# Patient Record
Sex: Male | Born: 1960 | Race: White | Hispanic: No | Marital: Married | Smoking: Never smoker
Health system: Southern US, Community
[De-identification: ages and names within clinical notes are randomized; demographics above are authoritative.]

## PROBLEM LIST (undated history)

## (undated) DIAGNOSIS — C029 Malignant neoplasm of tongue, unspecified: Secondary | ICD-10-CM

## (undated) HISTORY — DX: Malignant neoplasm of tongue, unspecified: C02.9

## (undated) HISTORY — PX: TONSILLECTOMY: SUR1361

---

## 1979-01-10 HISTORY — PX: OTHER SURGICAL HISTORY: SHX169

## 1994-01-09 HISTORY — PX: ANAL FISSURE REPAIR: SHX2312

## 2006-05-15 ENCOUNTER — Encounter: Payer: Self-pay | Admitting: Internal Medicine

## 2008-04-29 ENCOUNTER — Ambulatory Visit: Payer: Self-pay | Admitting: Family Medicine

## 2008-04-30 LAB — CONVERTED CEMR LAB
ALT: 17 units/L (ref 0–53)
AST: 22 units/L (ref 0–37)
Basophils Relative: 0.2 % (ref 0.0–3.0)
Bilirubin, Direct: 0.1 mg/dL (ref 0.0–0.3)
Chloride: 109 meq/L (ref 96–112)
Creatinine, Ser: 0.8 mg/dL (ref 0.4–1.5)
Eosinophils Relative: 2.7 % (ref 0.0–5.0)
GFR calc non Af Amer: 109.49 mL/min (ref >60)
HCT: 42.9 % (ref 39.0–52.0)
LDL Cholesterol: 140 mg/dL — ABNORMAL HIGH (ref 0–99)
MCV: 94.2 fL (ref 78.0–100.0)
Monocytes Absolute: 0.4 10*3/uL (ref 0.1–1.0)
Monocytes Relative: 7.9 % (ref 3.0–12.0)
Neutrophils Relative %: 62.6 % (ref 43.0–77.0)
Potassium: 4 meq/L (ref 3.5–5.1)
RBC: 4.56 M/uL (ref 4.22–5.81)
Total Bilirubin: 1 mg/dL (ref 0.3–1.2)
Total CHOL/HDL Ratio: 4
Total Protein: 6.6 g/dL (ref 6.0–8.3)
VLDL: 10.2 mg/dL (ref 0.0–40.0)
WBC: 4.5 10*3/uL (ref 4.5–10.5)

## 2009-11-09 ENCOUNTER — Telehealth: Payer: Self-pay | Admitting: Family Medicine

## 2009-11-10 ENCOUNTER — Ambulatory Visit: Payer: Self-pay | Admitting: Family Medicine

## 2010-02-08 NOTE — Progress Notes (Signed)
Summary: advise of bite  Phone Note Call from Patient Call back at [phone]   Caller: Patient--triage vm Summary of Call: was bitten by a dog on yesterday. wife suggests abx. wants advise of the nurse.    [facility]. Initial call taken by: Warnell Forester,  November 09, 2009 12:58 PM  Follow-up for Phone Call        left mess to return call  Follow-up by: Pura Spice, RN,  November 09, 2009 3:11 PM  Additional Follow-up for Phone Call Additional follow up Details #1::        spoke with pt stated got bit by dog yest( neighbors dog that he says is up to date with shots )  states the dog did break skin on rt hand which today he says area bruised "little red " no drainage from area. stateds he cleaned with H202 and abt oint.  does not know when last tetanus was.  appt given tomorrrow with Dr Clent Ridges.  Additional Follow-up by: Pura Spice, RN,  November 09, 2009 4:21 PM

## 2010-02-08 NOTE — Progress Notes (Signed)
Summary: [facility]P/[facility]  H[facility]P/[facility]   Imported By: Sherian Rein 06/19/2009 09:49:27  _____________________________________________________________________  External Attachment:    Type:   Image     Comment:   External Document

## 2010-02-08 NOTE — Assessment & Plan Note (Signed)
Summary: dog bite ? tdap / gh rn   Vital Signs:  Patient profile:   50 year old male O2 Sat:      98 % Temp:     98.4 degrees F Pulse rate:   70 / minute BP sitting:   120 / 84  (left arm)  Vitals Entered By: Pura Spice, RN (November 10, 2009 4:13 PM)                   CC: ck dog bite on rt palm hand    History of Present Illness: Here for a dog bite on the right hand that occurred yesterday. he went to pet his neighbor's dog and apparently this startled the dog. he bit him lightly, but otherwise the dog's behavior has been unremarkable. He feels fine other than some soreness at the site. He is not sure about the status of the dog's immunizations.   Allergies (verified): No Known Drug Allergies  Past History:  Past Medical History: Reviewed history from 04/29/2008 and no changes required. Unremarkable  Review of Systems  The patient denies anorexia, fever, weight loss, weight gain, vision loss, decreased hearing, hoarseness, chest pain, syncope, dyspnea on exertion, peripheral edema, prolonged cough, headaches, hemoptysis, abdominal pain, melena, hematochezia, severe indigestion/heartburn, hematuria, incontinence, genital sores, muscle weakness, suspicious skin lesions, transient blindness, difficulty walking, depression, unusual weight change, abnormal bleeding, enlarged lymph nodes, angioedema, breast masses, and testicular masses.    Physical Exam  General:  Well-developed,well-nourished,in no acute distress; alert,appropriate and cooperative throughout examination Extremities:  the right hand has a superficial puncture wound on the palm which is slightly puffy and red, and is a little tender. The fingers have full ROM   Impression & Recommendations:  Problem # 1:  DOG BITE (ICD-E906.0)  Complete Medication List: 1)  Bayer Low Strength 81 Mg Tbec (Aspirin) .... Take 1 tablet by mouth once a day 2)  Augmentin 875-125 Mg Tabs (Amoxicillin-pot  clavulanate) .... Two times a day  Other Orders: Tdap => 62yrs IM (51761) Admin 1st Vaccine (60737)  Patient Instructions: 1)  Please schedule a follow-up appointment as needed .  Prescriptions: AUGMENTIN 875-125 MG TABS (AMOXICILLIN-POT CLAVULANATE) two times a day  #14 x 0   Entered and Authorized by:   Nelwyn Salisbury MD   Signed by:   Nelwyn Salisbury MD on 11/10/2009   Method used:   [facility]        [facility]  [place] [address] [facility]       [address]       [place], [place]  [address]       Ph: [phone] or [phone]       Fax: [phone]   RxID:   [id number]    Orders Added: 1)  Tdap => 52yrs IM [90715] 2)  Admin 1st Vaccine [90471] 3)  Est. Patient Level III [82423]   Immunizations Administered:  Tetanus Vaccine:    Vaccine Type: Tdap    Site: left deltoid    Mfr: GlaxoSmithKline    Dose: 0.5 ml    Given by: Pura Spice, RN    Exp. Date: 10/29/2011    Lot #: [id number]    VIS given: 11/27/07 version given November 10, 2009.   Immunizations Administered:  Tetanus Vaccine:    Vaccine Type: Tdap    Site: left deltoid    Mfr: GlaxoSmithKline    Dose: 0.5 ml    Given by: Rene Kocher  Marlynn Perking, RN    Exp. Date: 10/29/2011    Lot #: [id number]    VIS given: 11/27/07 version given November 10, 2009.

## 2010-05-25 ENCOUNTER — Emergency Department (HOSPITAL_COMMUNITY)
Admission: EM | Admit: 2010-05-25 | Discharge: 2010-05-25 | Disposition: A | Discharge status: Home / Self Care | Payer: BC Managed Care – PPO | Attending: Emergency Medicine | Admitting: Emergency Medicine

## 2010-05-25 ENCOUNTER — Emergency Department (HOSPITAL_COMMUNITY): Payer: BC Managed Care – PPO

## 2010-05-25 DIAGNOSIS — S61209A Unspecified open wound of unspecified finger without damage to nail, initial encounter: Secondary | ICD-10-CM | POA: Insufficient documentation

## 2010-05-25 DIAGNOSIS — W278XXA Contact with other nonpowered hand tool, initial encounter: Secondary | ICD-10-CM | POA: Insufficient documentation

## 2010-05-25 DIAGNOSIS — Y92009 Unspecified place in unspecified non-institutional (private) residence as the place of occurrence of the external cause: Secondary | ICD-10-CM | POA: Insufficient documentation

## 2010-05-25 DIAGNOSIS — Y93H2 Activity, gardening and landscaping: Secondary | ICD-10-CM | POA: Insufficient documentation

## 2010-08-25 ENCOUNTER — Ambulatory Visit (INDEPENDENT_AMBULATORY_CARE_PROVIDER_SITE_OTHER): Payer: BC Managed Care – PPO | Admitting: Family Medicine

## 2010-08-25 ENCOUNTER — Encounter: Payer: Self-pay | Admitting: Family Medicine

## 2010-08-25 ENCOUNTER — Ambulatory Visit: Payer: BC Managed Care – PPO | Admitting: Family Medicine

## 2010-08-25 VITALS — BP 110/66 | HR 64 | Temp 98.2°F | Wt 194.0 lb

## 2010-08-25 DIAGNOSIS — H60399 Other infective otitis externa, unspecified ear: Secondary | ICD-10-CM

## 2010-08-25 DIAGNOSIS — H6090 Unspecified otitis externa, unspecified ear: Secondary | ICD-10-CM

## 2010-08-25 MED ORDER — NEOMYCIN-POLYMYXIN-HC 1 % OT SOLN: 4.0000 [drp] | Freq: Four times a day (QID) | OTIC | Status: DC

## 2010-08-25 NOTE — Progress Notes (Signed)
  Subjective:    Patient ID: Ryan Ingram, male    DOB: February 22, 1960, 50 y.o.   MRN: [id number]  HPI Here for 2 days of pressure and mild discomfort in the left ear. No real pain. No fever or sinus symptoms. He has been swimming lately.    Review of Systems  Constitutional: Negative.   HENT: Negative for hearing loss, ear pain, congestion, sore throat, postnasal drip and sinus pressure.   Eyes: Negative.        Objective:   Physical Exam  Constitutional: He appears well-developed and well-nourished.  HENT:  Right Ear: External ear normal.  Nose: Nose normal.  Mouth/Throat: Oropharynx is clear and moist. No oropharyngeal exudate.       Left external canal is red and swollen, and the TM is clear  Eyes: Conjunctivae are normal. Pupils are equal, round, and reactive to light.  Neck: No thyromegaly present.  Lymphadenopathy:    He has no cervical adenopathy.          Assessment & Plan:  Keep water out  of the ear for one week.

## 2011-05-30 ENCOUNTER — Telehealth: Payer: Self-pay

## 2011-05-30 NOTE — Telephone Encounter (Signed)
Pt states he found a tick on him this morning.  Pt states he has kept the tick in a bag.  Pt denies fever or redness at the site.  Pt would like to know if there is anything else he needs to do.  Pls advise.

## 2011-05-31 NOTE — Telephone Encounter (Signed)
Spoke with pt

## 2011-05-31 NOTE — Telephone Encounter (Signed)
Just observe and come in for OV if he gets any rash or fever or other symptoms

## 2011-06-13 ENCOUNTER — Other Ambulatory Visit: Payer: BC Managed Care – PPO

## 2011-06-14 ENCOUNTER — Other Ambulatory Visit (INDEPENDENT_AMBULATORY_CARE_PROVIDER_SITE_OTHER): Payer: BC Managed Care – PPO

## 2011-06-14 DIAGNOSIS — Z0000 Encounter for general adult medical examination without abnormal findings: Secondary | ICD-10-CM

## 2011-06-14 LAB — BASIC METABOLIC PANEL
BUN: 16 mg/dL (ref 6–23)
CO2: 27 mEq/L (ref 19–32)
Calcium: 9 mg/dL (ref 8.4–10.5)
Creatinine, Ser: 0.8 mg/dL (ref 0.4–1.5)
GFR: 106.57 mL/min (ref >60.00)
Glucose, Bld: 94 mg/dL (ref 70–99)
Sodium: 140 mEq/L (ref 135–145)

## 2011-06-14 LAB — POCT URINALYSIS DIPSTICK
Bilirubin, UA: NEGATIVE
Blood, UA: NEGATIVE
Glucose, UA: NEGATIVE
Leukocytes, UA: NEGATIVE
Nitrite, UA: NEGATIVE
Urobilinogen, UA: 0.2

## 2011-06-14 LAB — CBC WITH DIFFERENTIAL/PLATELET
Basophils Absolute: 0 10*3/uL (ref 0.0–0.1)
Eosinophils Absolute: 0.1 10*3/uL (ref 0.0–0.7)
Lymphocytes Relative: 25.7 % (ref 12.0–46.0)
MCHC: 33.2 g/dL (ref 30.0–36.0)
Monocytes Relative: 8.2 % (ref 3.0–12.0)
Neutro Abs: 3.3 10*3/uL (ref 1.4–7.7)
Neutrophils Relative %: 63 % (ref 43.0–77.0)
Platelets: 195 10*3/uL (ref 150.0–400.0)
RDW: 13 % (ref 11.5–14.6)

## 2011-06-14 LAB — HEPATIC FUNCTION PANEL
AST: 19 U/L (ref 0–37)
Albumin: 3.9 g/dL (ref 3.5–5.2)
Alkaline Phosphatase: 52 U/L (ref 39–117)
Bilirubin, Direct: 0.1 mg/dL (ref 0.0–0.3)
Total Protein: 6.4 g/dL (ref 6.0–8.3)

## 2011-06-14 LAB — TSH: TSH: 2.35 u[IU]/mL (ref 0.35–5.50)

## 2011-06-14 LAB — LIPID PANEL
HDL: 47.9 mg/dL (ref >39.00)
Total CHOL/HDL Ratio: 4
Triglycerides: 72 mg/dL (ref 0.0–149.0)

## 2011-06-15 NOTE — Progress Notes (Signed)
Quick Note:  Attempt to call- VM on cell# [phone] - LMTCB if questions - lab normal except mildly high cholestrol - diet and exercise ______

## 2011-06-16 ENCOUNTER — Encounter: Payer: Self-pay | Admitting: Family Medicine

## 2011-06-29 ENCOUNTER — Encounter: Payer: BC Managed Care – PPO | Admitting: Family Medicine

## 2011-08-22 ENCOUNTER — Ambulatory Visit (INDEPENDENT_AMBULATORY_CARE_PROVIDER_SITE_OTHER): Payer: BC Managed Care – PPO | Admitting: Family Medicine

## 2011-08-22 ENCOUNTER — Encounter: Payer: Self-pay | Admitting: Family Medicine

## 2011-08-22 VITALS — BP 90/60 | HR 72 | Temp 98.4°F | Resp 16 | Ht 73.25 in | Wt 200.0 lb

## 2011-08-22 DIAGNOSIS — Z0000 Encounter for general adult medical examination without abnormal findings: Secondary | ICD-10-CM

## 2011-08-22 NOTE — Progress Notes (Signed)
  Subjective:    Patient ID: Ryan Ingram, male    DOB: 30-Nov-1960, 51 y.o.   MRN: [id number]  HPI 51 yr old male for a cpx. He feels fine and has no concerns.    Review of Systems  Constitutional: Negative.   HENT: Negative.   Eyes: Negative.   Respiratory: Negative.   Cardiovascular: Negative.   Gastrointestinal: Negative.   Genitourinary: Negative.   Musculoskeletal: Negative.   Skin: Negative.   Neurological: Negative.   Hematological: Negative.   Psychiatric/Behavioral: Negative.        Objective:   Physical Exam  Constitutional: He is oriented to person, place, and time. He appears well-developed and well-nourished. No distress.  HENT:  Head: Normocephalic and atraumatic.  Right Ear: External ear normal.  Left Ear: External ear normal.  Nose: Nose normal.  Mouth/Throat: Oropharynx is clear and moist. No oropharyngeal exudate.  Eyes: Conjunctivae and EOM are normal. Pupils are equal, round, and reactive to light. Right eye exhibits no discharge. Left eye exhibits no discharge. No scleral icterus.  Neck: Neck supple. No JVD present. No tracheal deviation present. No thyromegaly present.  Cardiovascular: Normal rate, regular rhythm, normal heart sounds and intact distal pulses.  Exam reveals no gallop and no friction rub.   No murmur heard.      EKG normal  Pulmonary/Chest: Effort normal and breath sounds normal. No respiratory distress. He has no wheezes. He has no rales. He exhibits no tenderness.  Abdominal: Soft. Bowel sounds are normal. He exhibits no distension and no mass. There is no tenderness. There is no rebound and no guarding.  Genitourinary: Rectum normal, prostate normal and penis normal. Guaiac negative stool. No penile tenderness.  Musculoskeletal: Normal range of motion. He exhibits no edema and no tenderness.  Lymphadenopathy:    He has no cervical adenopathy.  Neurological: He is alert and oriented to person, place, and time. He has normal  reflexes. No cranial nerve deficit. He exhibits normal muscle tone. Coordination normal.  Skin: Skin is warm and dry. No rash noted. He is not diaphoretic. No erythema. No pallor.  Psychiatric: He has a normal mood and affect. His behavior is normal. Judgment and thought content normal.          Assessment & Plan:  Well exam. Set up a colonoscopy

## 2012-01-05 ENCOUNTER — Ambulatory Visit (INDEPENDENT_AMBULATORY_CARE_PROVIDER_SITE_OTHER): Payer: BC Managed Care – PPO | Admitting: Family Medicine

## 2012-01-05 ENCOUNTER — Encounter: Payer: Self-pay | Admitting: Family Medicine

## 2012-01-05 VITALS — BP 120/80 | HR 71 | Temp 98.0°F | Ht 72.5 in | Wt 202.0 lb

## 2012-01-05 DIAGNOSIS — J329 Chronic sinusitis, unspecified: Secondary | ICD-10-CM

## 2012-01-05 MED ORDER — AMOXICILLIN 875 MG PO TABS: 875.0000 mg | ORAL_TABLET | Freq: Two times a day (BID) | ORAL | Status: DC

## 2012-01-05 NOTE — Patient Instructions (Addendum)
INSTRUCTIONS FOR UPPER RESPIRATORY INFECTION:  -plenty of rest and fluids  -take antibiotic as instructed  -nasal saline wash 2-3 times daily (use prepackaged nasal saline or bottled/distilled water if making your own)   -can use sinex nasal spray for drainage and nasal congestion - but do NOT use longer then 3-4 days  -can use tylenol or ibuprofen as directed for aches and sorethroat  -in the winter time, using a humidifier at night is helpful (please follow cleaning instructions)  -if you are taking a cough medication - use only as directed, may also try a teaspoon of honey to coat the throat and throat lozenges  -for sore throat, salt water gargles can help  -follow up if you have fevers, facial pain, tooth pain, difficulty breathing or are worsening or not getting better in 5-7 days

## 2012-01-05 NOTE — Progress Notes (Signed)
Chief Complaint  Patient presents with  . Sinusitis    HPI:  Acute visit for ? Ear pain: -last 7 days, getting worse -symptoms: nasal congestion, drainage, cough, R sided maxillary sinus  pain, slept with night guard and cause some jaw pain -denies: fevers, chills, SOB -hx of sinus infection in the past - last year was most recent one -family members with cold symptoms -has tried OTC cold medication and advil  ROS: See pertinent positives and negatives per HPI.  No past medical history on file.  Family History  Problem Relation Age of Onset  . Cancer      breast & lung  . Coronary artery disease    . Hyperlipidemia    . Hypertension      History   Social History  . Marital Status: Married    Spouse Name: N/A    Number of Children: N/A  . Years of Education: N/A   Social History Main Topics  . Smoking status: Never Smoker   . Smokeless tobacco: Never Used  . Alcohol Use: Yes  . Drug Use: No  . Sexually Active: None   Other Topics Concern  . None   Social History Narrative  . None    Current outpatient prescriptions:amoxicillin (AMOXIL) 875 MG tablet, Take 1 tablet (875 mg total) by mouth 2 (two) times daily., Disp: 20 tablet, Rfl: 0  EXAM:  Filed Vitals:   01/05/12 1022  BP: 120/80  Pulse: 71  Temp: 98 F (36.7 C)    Body mass index is 27.02 kg/(m^2).  GENERAL: vitals reviewed and listed above, alert, oriented, appears well hydrated and in no acute distress  HEENT: atraumatic, conjunttiva clear, no obvious abnormalities on inspection of external nose and ears, ear canals and TMs without sihgnsof infection, L tM abnormal in architecture (pt reports hx rupture), white nasal congesiton, R max sinus TTP, PND  NECK: no obvious masses on inspection  LUNGS: clear to auscultation bilaterally, no wheezes, rales or rhonchi, good air movement  CV: HRRR, no peripheral edema  MS: moves all extremities without noticeable abnormality  PSYCH: pleasant and  cooperative, no obvious depression or anxiety  ASSESSMENT AND PLAN:  Discussed the following assessment and plan:  1. Sinusitis  amoxicillin (AMOXIL) 875 MG tablet   -with R max sinus pain and worsening, discussed tx options including watchful waiting versus abx - pt prefers to start abx and understands risks -Patient advised to return or notify a doctor immediately if symptoms worsen or persist or new concerns arise.  Patient Instructions  INSTRUCTIONS FOR UPPER RESPIRATORY INFECTION:  -plenty of rest and fluids  -take antibiotic as instructed  -nasal saline wash 2-3 times daily (use prepackaged nasal saline or bottled/distilled water if making your own)   -can use sinex nasal spray for drainage and nasal congestion - but do NOT use longer then 3-4 days  -can use tylenol or ibuprofen as directed for aches and sorethroat  -in the winter time, using a humidifier at night is helpful (please follow cleaning instructions)  -if you are taking a cough medication - use only as directed, may also try a teaspoon of honey to coat the throat and throat lozenges  -for sore throat, salt water gargles can help  -follow up if you have fevers, facial pain, tooth pain, difficulty breathing or are worsening or not getting better in 5-7 days      Zahava Quant R.

## 2012-11-15 ENCOUNTER — Other Ambulatory Visit (INDEPENDENT_AMBULATORY_CARE_PROVIDER_SITE_OTHER): Payer: BC Managed Care – PPO

## 2012-11-15 ENCOUNTER — Ambulatory Visit (INDEPENDENT_AMBULATORY_CARE_PROVIDER_SITE_OTHER): Payer: BC Managed Care – PPO

## 2012-11-15 DIAGNOSIS — Z23 Encounter for immunization: Secondary | ICD-10-CM

## 2012-11-15 DIAGNOSIS — Z0000 Encounter for general adult medical examination without abnormal findings: Secondary | ICD-10-CM

## 2012-11-15 LAB — POCT URINALYSIS DIPSTICK
Bilirubin, UA: NEGATIVE
Blood, UA: NEGATIVE
Ketones, UA: NEGATIVE
Leukocytes, UA: NEGATIVE
Spec Grav, UA: 1.02
pH, UA: 5.5

## 2012-11-15 LAB — CBC WITH DIFFERENTIAL/PLATELET
Basophils Relative: 0.5 % (ref 0.0–3.0)
Eosinophils Relative: 1.9 % (ref 0.0–5.0)
Hemoglobin: 14.3 g/dL (ref 13.0–17.0)
Lymphocytes Relative: 26.1 % (ref 12.0–46.0)
Monocytes Relative: 7.9 % (ref 3.0–12.0)
Neutro Abs: 3.8 10*3/uL (ref 1.4–7.7)
Neutrophils Relative %: 63.6 % (ref 43.0–77.0)
RBC: 4.7 Mil/uL (ref 4.22–5.81)
WBC: 5.9 10*3/uL (ref 4.5–10.5)

## 2012-11-15 LAB — BASIC METABOLIC PANEL
Calcium: 9 mg/dL (ref 8.4–10.5)
Creatinine, Ser: 0.9 mg/dL (ref 0.4–1.5)
Sodium: 138 mEq/L (ref 135–145)

## 2012-11-15 LAB — HEPATIC FUNCTION PANEL
ALT: 18 U/L (ref 0–53)
AST: 21 U/L (ref 0–37)
Alkaline Phosphatase: 52 U/L (ref 39–117)
Bilirubin, Direct: 0.1 mg/dL (ref 0.0–0.3)
Total Protein: 6.7 g/dL (ref 6.0–8.3)

## 2012-11-15 LAB — LIPID PANEL
HDL: 46.8 mg/dL (ref >39.00)
LDL Cholesterol: 121 mg/dL — ABNORMAL HIGH (ref 0–99)
VLDL: 17.2 mg/dL (ref 0.0–40.0)

## 2012-11-18 NOTE — Progress Notes (Signed)
Quick Note:  Pt has appointment on 11/22/12 will go over then. ______

## 2012-11-22 ENCOUNTER — Encounter: Payer: Self-pay | Admitting: Family Medicine

## 2012-11-22 ENCOUNTER — Ambulatory Visit (INDEPENDENT_AMBULATORY_CARE_PROVIDER_SITE_OTHER): Payer: BC Managed Care – PPO | Admitting: Family Medicine

## 2012-11-22 VITALS — BP 120/80 | HR 68 | Temp 97.8°F | Ht 72.5 in | Wt 200.0 lb

## 2012-11-22 DIAGNOSIS — Z0000 Encounter for general adult medical examination without abnormal findings: Secondary | ICD-10-CM

## 2012-11-22 NOTE — Progress Notes (Signed)
  Subjective:    Patient ID: Ryan Ingram, male    DOB: July 03, 1960, 52 y.o.   MRN: [id number]  HPI 52 yr old male for a cpx. He feels well.    Review of Systems  Constitutional: Negative.   HENT: Negative.   Eyes: Negative.   Respiratory: Negative.   Cardiovascular: Negative.   Gastrointestinal: Negative.   Genitourinary: Negative.   Musculoskeletal: Negative.   Skin: Negative.   Neurological: Negative.   Psychiatric/Behavioral: Negative.        Objective:   Physical Exam  Constitutional: He is oriented to person, place, and time. He appears well-developed and well-nourished. No distress.  HENT:  Head: Normocephalic and atraumatic.  Right Ear: External ear normal.  Left Ear: External ear normal.  Nose: Nose normal.  Mouth/Throat: Oropharynx is clear and moist. No oropharyngeal exudate.  Eyes: Conjunctivae and EOM are normal. Pupils are equal, round, and reactive to light. Right eye exhibits no discharge. Left eye exhibits no discharge. No scleral icterus.  Neck: Neck supple. No JVD present. No tracheal deviation present. No thyromegaly present.  Cardiovascular: Normal rate, regular rhythm, normal heart sounds and intact distal pulses.  Exam reveals no gallop and no friction rub.   No murmur heard. EKG normal  Pulmonary/Chest: Effort normal and breath sounds normal. No respiratory distress. He has no wheezes. He has no rales. He exhibits no tenderness.  Abdominal: Soft. Bowel sounds are normal. He exhibits no distension and no mass. There is no tenderness. There is no rebound and no guarding.  Genitourinary: Rectum normal, prostate normal and penis normal. Guaiac negative stool. No penile tenderness.  Musculoskeletal: Normal range of motion. He exhibits no edema and no tenderness.  Lymphadenopathy:    He has no cervical adenopathy.  Neurological: He is alert and oriented to person, place, and time. He has normal reflexes. No cranial nerve deficit. He exhibits normal  muscle tone. Coordination normal.  Skin: Skin is warm and dry. No rash noted. He is not diaphoretic. No erythema. No pallor.  Psychiatric: He has a normal mood and affect. His behavior is normal. Judgment and thought content normal.          Assessment & Plan:  Well exam.

## 2012-11-22 NOTE — Progress Notes (Signed)
Pre visit review using our clinic review tool, if applicable. No additional management support is needed unless otherwise documented below in the visit note. 

## 2012-12-23 ENCOUNTER — Encounter: Payer: Self-pay | Admitting: Internal Medicine

## 2013-02-20 ENCOUNTER — Encounter: Payer: BC Managed Care – PPO | Admitting: Internal Medicine

## 2013-07-17 ENCOUNTER — Ambulatory Visit (INDEPENDENT_AMBULATORY_CARE_PROVIDER_SITE_OTHER): Payer: BC Managed Care – PPO | Admitting: Physician Assistant

## 2013-07-17 ENCOUNTER — Encounter: Payer: Self-pay | Admitting: Physician Assistant

## 2013-07-17 VITALS — BP 92/60 | HR 72 | Temp 98.1°F | Resp 18 | Wt 203.0 lb

## 2013-07-17 DIAGNOSIS — H6692 Otitis media, unspecified, left ear: Secondary | ICD-10-CM

## 2013-07-17 DIAGNOSIS — H60399 Other infective otitis externa, unspecified ear: Secondary | ICD-10-CM

## 2013-07-17 DIAGNOSIS — H6690 Otitis media, unspecified, unspecified ear: Secondary | ICD-10-CM

## 2013-07-17 DIAGNOSIS — H60392 Other infective otitis externa, left ear: Secondary | ICD-10-CM

## 2013-07-17 MED ORDER — AMOXICILLIN 875 MG PO TABS
875.0000 mg | ORAL_TABLET | Freq: Two times a day (BID) | ORAL | Status: DC
Start: 2013-07-17 — End: 2014-07-10

## 2013-07-17 MED ORDER — CIPROFLOXACIN-HYDROCORTISONE 0.2-1 % OT SUSP: 3.0000 [drp] | Freq: Two times a day (BID) | OTIC | Status: DC

## 2013-07-17 NOTE — Patient Instructions (Signed)
Amoxicillin twice daily for 10 days to treat ear infection.  Cipro otic drops. Instill 3 drops in ear tonight, then place cotton in ear canal. Leave canal and place for 48 hours. Tomorrow, place 3 drops onto the cotton wick without removing in the morning, and the same at night. The following morning remove the cotton wick, allow your ear to drain, and then instill 3 drops twice daily there after. Total therapy time should be 7 days.  If emergency symptoms discussed during visit developed, seek medical attention immediately.  Followup in 10 days to reassess, or for worsening or persistent symptoms despite treatment.        Otitis Media Otitis media is redness, soreness, and puffiness (swelling) in the space just behind your eardrum (middle ear). It may be caused by allergies or infection. It often happens along with a cold. HOME CARE  Take your medicine as told. Finish it even if you start to feel better.  Only take over-the-counter or prescription medicines for pain, discomfort, or fever as told by your doctor.  Follow up with your doctor as told. GET HELP IF:  You have otitis media only in one ear, or bleeding from your nose, or both.  You notice a lump on your neck.  You are not getting better in 3-5 days.  You feel worse instead of better. GET HELP RIGHT AWAY IF:   You have pain that is not helped with medicine.  You have puffiness, redness, or pain around your ear.  You get a stiff neck.  You cannot move part of your face (paralysis).  You notice that the bone behind your ear hurts when you touch it. MAKE SURE YOU:   Understand these instructions.  Will watch your condition.  Will get help right away if you are not doing well or get worse. Document Released: 06/14/2007 Document Revised: 12/31/2012 Document Reviewed: 07/23/2012 [facility] Patient Information 2015 [place], [place]. This information is not intended to replace advice given to you by your health care  provider. Make sure you discuss any questions you have with your health care provider. Otitis Externa Otitis externa is a germ infection in the outer ear. The outer ear is the area from the eardrum to the outside of the ear. Otitis externa is sometimes called "swimmer's ear." HOME CARE  Put drops in the ear as told by your doctor.  Only take medicine as told by your doctor.  If you have diabetes, your doctor may give you more directions. Follow your doctor's directions.  Keep all doctor visits as told. To avoid another infection:  Keep your ear dry. Use the corner of a towel to dry your ear after swimming or bathing.  Avoid scratching or putting things inside your ear.  Avoid swimming in lakes, dirty water, or pools that use a chemical called chlorine poorly.  You may use ear drops after swimming. Combine equal amounts of white vinegar and alcohol in a bottle. Put 3 or 4 drops in each ear. GET HELP RIGHT AWAY IF:   You have a fever.  Your ear is still red, puffy (swollen), or painful after 3 days.  You still have yellowish-white fluid (pus) coming from the ear after 3 days.  Your redness, puffiness, or pain gets worse.  You have a really bad headache.  You have redness, puffiness, pain, or tenderness behind your ear. MAKE SURE YOU:   Understand these instructions.  Will watch your condition.  Will get help right away if you are  not doing well or get worse. Document Released: 06/14/2007 Document Revised: 03/20/2011 Document Reviewed: 01/12/2011 [facility] Patient Information 2015 [place], [place]. This information is not intended to replace advice given to you by your health care provider. Make sure you discuss any questions you have with your health care provider.

## 2013-07-17 NOTE — Progress Notes (Signed)
Pre visit review using our clinic review tool, if applicable. No additional management support is needed unless otherwise documented below in the visit note. 

## 2013-07-17 NOTE — Progress Notes (Signed)
Subjective:    Patient ID: Ryan Ingram, male    DOB: 02-25-1960, 53 y.o.   MRN: [id number]  Otalgia  There is pain in the left ear. This is a new problem. The current episode started yesterday. The problem occurs constantly. The problem has been gradually worsening. There has been no fever. The patient is experiencing no pain. Associated symptoms include ear discharge and hearing loss (feels plugged up). Pertinent negatives include no abdominal pain, coughing, diarrhea, drainage, headaches, neck pain, rash, rhinorrhea, sore throat or vomiting. Associated symptoms comments: Ear Odor. Treatments tried: anithistamines. The treatment provided no relief. There is no history of hearing loss or a tympanostomy tube.      Review of Systems  Constitutional: Negative for fever and chills.  HENT: Positive for ear discharge, ear pain and hearing loss (feels plugged up). Negative for rhinorrhea, sinus pressure, sore throat and tinnitus.   Respiratory: Negative for cough and shortness of breath.   Cardiovascular: Negative for chest pain.  Gastrointestinal: Negative for nausea, vomiting, abdominal pain and diarrhea.  Musculoskeletal: Negative for neck pain.  Skin: Negative for rash.  Neurological: Negative for headaches.  All other systems reviewed and are negative.      No past medical history on file.  History   Social History  . Marital Status: Married    Spouse Name: N/A    Number of Children: N/A  . Years of Education: N/A   Occupational History  . Not on file.   Social History Main Topics  . Smoking status: Never Smoker   . Smokeless tobacco: Never Used  . Alcohol Use: Yes     Comment: occ  . Drug Use: No  . Sexual Activity: Not on file   Other Topics Concern  . Not on file   Social History Narrative  . No narrative on file    Past Surgical History  Procedure Laterality Date  . Anal fissure repair  1996  . Wisdom teeth removal  1981  . Tonsillectomy       Family History  Problem Relation Age of Onset  . Cancer      breast & lung  . Coronary artery disease    . Hyperlipidemia    . Hypertension      No Known Allergies  No current outpatient prescriptions on file prior to visit.   No current facility-administered medications on file prior to visit.    EXAM: BP 92/60  Pulse 72  Temp(Src) 98.1 F (36.7 C) (Oral)  Resp 18  Wt 203 lb (92.08 kg)     Objective:   Physical Exam  Nursing note and vitals reviewed. Constitutional: He is oriented to person, place, and time. He appears well-developed and well-nourished. No distress.  HENT:  Head: Normocephalic and atraumatic.  Right Ear: External ear normal.  Left ear canal occluded with cerumen. Lavaged. Erythematous. Left TM erythematous. Right TM normal.  Eyes: Conjunctivae and EOM are normal. Pupils are equal, round, and reactive to light.  Neck: Normal range of motion.  Cardiovascular: Normal rate, regular rhythm and intact distal pulses.   Pulmonary/Chest: Effort normal and breath sounds normal. No respiratory distress. He exhibits no tenderness.  Musculoskeletal: Normal range of motion.  Neurological: He is alert and oriented to person, place, and time.  Skin: Skin is warm and dry. No rash noted. He is not diaphoretic. No erythema. No pallor.  Psychiatric: He has a normal mood and affect. His behavior is normal. Judgment and thought content normal.  Lab Results  Component Value Date   WBC 5.9 11/15/2012   HGB 14.3 11/15/2012   HCT 42.0 11/15/2012   PLT 224.0 11/15/2012   GLUCOSE 96 11/15/2012   CHOL 185 11/15/2012   TRIG 86.0 11/15/2012   HDL 46.80 11/15/2012   LDLCALC 121* 11/15/2012   ALT 18 11/15/2012   AST 21 11/15/2012   NA 138 11/15/2012   K 4.1 11/15/2012   CL 104 11/15/2012   CREATININE 0.9 11/15/2012   BUN 13 11/15/2012   CO2 28 11/15/2012   TSH 2.68 11/15/2012   PSA 1.01 11/15/2012        Assessment & Plan:  Ryan Ingram was seen today for left ear  pain.  Diagnoses and associated orders for this visit:  Acute left otitis media, recurrence not specified, unspecified otitis media type Comments: Will treat with amoxicillin. - amoxicillin (AMOXIL) 875 MG tablet; Take 1 tablet (875 mg total) by mouth 2 (two) times daily.  Otitis, externa, infective, left Comments: Will treat with cipro otic. - ciprofloxacin-hydrocortisone (CIPRO HC OTIC) otic suspension; Place 3 drops into the left ear 2 (two) times daily.    Return precautions provided, and patient handout on Otitis media and externa.  Cipro otic drops. Instill 3 drops in ear tonight, then place cotton in ear canal. Leave canal and place for 48 hours. Tomorrow, place 3 drops onto the cotton wick without removing in the morning, and the same at night. The following morning remove the cotton wick, allow your ear to drain, and then instill 3 drops twice daily there after. Total therapy time should be 7 days.  Plan to follow up in about 10 days to reassess, or for worsening or persistent symptoms despite treatment.  Patient Instructions  Amoxicillin twice daily for 10 days to treat ear infection.  Cipro otic drops. Instill 3 drops in ear tonight, then place cotton in ear canal. Leave canal and place for 48 hours. Tomorrow, place 3 drops onto the cotton wick without removing in the morning, and the same at night. The following morning remove the cotton wick, allow your ear to drain, and then instill 3 drops twice daily there after. Total therapy time should be 7 days.  If emergency symptoms discussed during visit developed, seek medical attention immediately.  Followup in 10 days to reassess, or for worsening or persistent symptoms despite treatment.

## 2013-07-28 ENCOUNTER — Ambulatory Visit (INDEPENDENT_AMBULATORY_CARE_PROVIDER_SITE_OTHER): Payer: BC Managed Care – PPO | Admitting: Family Medicine

## 2013-07-28 ENCOUNTER — Encounter: Payer: Self-pay | Admitting: Family Medicine

## 2013-07-28 VITALS — BP 130/71 | HR 66 | Temp 98.1°F | Ht 72.5 in | Wt 200.0 lb

## 2013-07-28 DIAGNOSIS — H60392 Other infective otitis externa, left ear: Secondary | ICD-10-CM

## 2013-07-28 DIAGNOSIS — H60399 Other infective otitis externa, unspecified ear: Secondary | ICD-10-CM

## 2013-07-28 NOTE — Progress Notes (Signed)
Pre visit review using our clinic review tool, if applicable. No additional management support is needed unless otherwise documented below in the visit note. 

## 2013-07-28 NOTE — Progress Notes (Signed)
   Subjective:    Patient ID: Ryan Ingram, male    DOB: October 31, 1960, 53 y.o.   MRN: [id number]  HPI Here to follow up on left ear infections. He has had trouble with his left ear for many years and he was seen here recently for mild discomfort and decreased hearing in the left ear. He also had drainage of fluid from the ear which had a foul odor. He was given Cipro HC Otic drops and a course of Amoxicillin. Now he feels better and the drainage has stopped, but he still notices some "crackling" in the ear.    Review of Systems  Constitutional: Negative.   HENT: Positive for ear discharge, ear pain and hearing loss. Negative for congestion, facial swelling, postnasal drip and sinus pressure.   Eyes: Negative.   Respiratory: Negative.        Objective:   Physical Exam  Constitutional: He appears well-developed and well-nourished.  HENT:  Right Ear: External ear normal.  Nose: Nose normal.  Mouth/Throat: Oropharynx is clear and moist.  Left ear canal is not red but has some debris deep inside up against the TM.   Eyes: Conjunctivae are normal.  Lymphadenopathy:    He has no cervical adenopathy.          Assessment & Plan:  He probably has a fungal infection, so we will refer him to ENT to manage.

## 2014-07-10 ENCOUNTER — Encounter: Payer: Self-pay | Admitting: Internal Medicine

## 2014-07-10 ENCOUNTER — Ambulatory Visit (INDEPENDENT_AMBULATORY_CARE_PROVIDER_SITE_OTHER)
Admission: RE | Admit: 2014-07-10 | Discharge: 2014-07-10 | Disposition: A | Discharge status: Home / Self Care | Payer: BLUE CROSS/BLUE SHIELD | Source: Ambulatory Visit | Attending: Internal Medicine | Admitting: Internal Medicine

## 2014-07-10 ENCOUNTER — Ambulatory Visit (INDEPENDENT_AMBULATORY_CARE_PROVIDER_SITE_OTHER): Payer: BLUE CROSS/BLUE SHIELD | Admitting: Internal Medicine

## 2014-07-10 ENCOUNTER — Other Ambulatory Visit (INDEPENDENT_AMBULATORY_CARE_PROVIDER_SITE_OTHER): Payer: BLUE CROSS/BLUE SHIELD

## 2014-07-10 VITALS — BP 100/62 | HR 58 | Ht 72.5 in | Wt 210.0 lb

## 2014-07-10 DIAGNOSIS — R058 Other specified cough: Secondary | ICD-10-CM

## 2014-07-10 DIAGNOSIS — R05 Cough: Secondary | ICD-10-CM

## 2014-07-10 LAB — CBC WITH DIFFERENTIAL/PLATELET
Basophils Absolute: 0 10*3/uL (ref 0.0–0.1)
Basophils Relative: 0.4 % (ref 0.0–3.0)
EOS PCT: 2.5 % (ref 0.0–5.0)
Eosinophils Absolute: 0.1 10*3/uL (ref 0.0–0.7)
HEMATOCRIT: 43.7 % (ref 39.0–52.0)
HEMOGLOBIN: 14.9 g/dL (ref 13.0–17.0)
Lymphocytes Relative: 27.7 % (ref 12.0–46.0)
Lymphs Abs: 1.6 10*3/uL (ref 0.7–4.0)
MCHC: 34.1 g/dL (ref 30.0–36.0)
MCV: 89.5 fl (ref 78.0–100.0)
MONO ABS: 0.5 10*3/uL (ref 0.1–1.0)
MONOS PCT: 9 % (ref 3.0–12.0)
Neutro Abs: 3.5 10*3/uL (ref 1.4–7.7)
Neutrophils Relative %: 60.4 % (ref 43.0–77.0)
Platelets: 210 10*3/uL (ref 150.0–400.0)
RBC: 4.89 Mil/uL (ref 4.22–5.81)
RDW: 12.8 % (ref 11.5–15.5)
WBC: 5.8 10*3/uL (ref 4.0–10.5)

## 2014-07-10 MED ORDER — FAMOTIDINE 20 MG PO TABS: ORAL_TABLET | ORAL | Status: DC

## 2014-07-10 MED ORDER — PANTOPRAZOLE SODIUM 40 MG PO TBEC: 40.0000 mg | DELAYED_RELEASE_TABLET | Freq: Every day | ORAL | Status: DC

## 2014-07-10 NOTE — Progress Notes (Signed)
Quick Note:  Spoke with pt and notified of results per Dr. Wert. Pt verbalized understanding and denied any questions.  ______ 

## 2014-07-10 NOTE — Progress Notes (Signed)
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 asthma   07/10/2014 1st [facility] office visit/ Doyt Castellana   Chief Complaint  Patient presents with  . Pulmonary Consult    Self referral. Pt c/o cough and "heavy breathing" for the past 6 months. Cough occurs while eating. He clears his throat often.   symptoms daily but never wakes him up/ chips/crackers make cough worse, assoc with wt gain from  190 vs 210  Most aerobic = not much due to knees/ no problem with swimming/ not really limited  by breathing/ no excess or purulent sputum  No obvious other patterns in day to day or daytime variabilty or assoccp or chest tightness, subjective wheeze overt sinus or hb symptoms. No unusual exp hx or h/o childhood pna/ asthma or knowledge of premature birth.  Sleeping ok without nocturnal  or early am exacerbation  of respiratory  c/o's or need for noct saba. Also denies any obvious fluctuation of symptoms with weather or environmental changes or other aggravating or alleviating factors except as outlined above   Current Medications, Allergies, Complete Past Medical History, Past Surgical History, Family History, and Social History were reviewed in [facility] record.            Review of Systems  Constitutional: Negative for fever, chills, activity change, appetite change and unexpected weight change.  HENT: Negative for congestion, dental problem, postnasal drip, rhinorrhea, sneezing, sore throat, trouble swallowing and voice change.   Eyes: Negative for visual disturbance.  Respiratory: Positive for cough. Negative for  choking and shortness of breath.   Cardiovascular: Negative for chest pain and leg swelling.  Gastrointestinal: Negative for nausea, vomiting and abdominal pain.  Genitourinary: Negative for difficulty urinating.  Musculoskeletal: Negative for arthralgias.  Skin: Negative for rash.  Psychiatric/Behavioral: Negative for behavioral problems and confusion.       Objective:   Physical Exam    amb wm nad   Wt Readings from Last 3 Encounters:  07/10/14 210 lb (95.255 kg)  07/28/13 200 lb (90.719 kg)  07/17/13 203 lb (92.08 kg)    Vital signs reviewed  HEENT: nl dentition, turbinates, and orophanx. Nl external ear canals without cough reflex   NECK :  without JVD/Nodes/TM/ nl carotid upstrokes bilaterally   LUNGS: no acc muscle use, clear to A and P bilaterally without cough on insp or exp maneuvers   CV:  RRR  no s3 or murmur or increase in P2, no edema   ABD:  soft and nontender with nl excursion in the supine position. No bruits or organomegaly, bowel sounds nl  MS:  warm without deformities, calf tenderness, cyanosis or clubbing  SKIN: warm and dry without lesions    NEURO:  alert, approp, no deficits    CXR PA and Lateral:   07/10/2014 :     I personally reviewed images and agree with radiology impression as follows:     The lungs are adequately inflated. There is no focal infiltrate. There is no pleural effusion. No pulmonary parenchymal or mediastinal or hilar mass is observed. The  heart and pulmonary vascularity are normal. The bony thorax is unremarkable.             Assessment & Plan:

## 2014-07-10 NOTE — Patient Instructions (Addendum)
Please remember to go to the lab and x-ray department downstairs for your tests - we will call you with the results when they are available.  Pantoprazole (protonix) 40 mg   Take  30-60 min before first meal of the day and Pepcid (famotidine)  20 mg one @  bedtime until return to office - this is the best way to tell whether stomach acid is contributing to your problem.    GERD (REFLUX)  is an extremely common cause of respiratory symptoms just like yours , many times with no obvious heartburn at all.    It can be treated with medication, but also with lifestyle changes including elevation of the head of your bed (ideally with 6 inch  bed blocks),  Smoking cessation, avoidance of late meals, excessive alcohol, and avoid fatty foods, chocolate, peppermint, colas, red wine, and acidic juices such as orange juice.  NO MINT OR MENTHOL PRODUCTS SO NO COUGH DROPS  USE SUGARLESS CANDY INSTEAD (Jolley ranchers or Stover's or Life Savers) or even ice chips will also do - the key is to swallow to prevent all throat clearing. NO OIL BASED VITAMINS - use powdered substitutes.    make sure you are taking certrazine (Zyrtec) as needed for dripping nose  Please schedule a follow up office visit in 6 weeks, call sooner if needed

## 2014-07-11 ENCOUNTER — Encounter: Payer: Self-pay | Admitting: Internal Medicine

## 2014-07-11 NOTE — Assessment & Plan Note (Signed)
The most common causes of chronic cough in immunocompetent adults include the following: upper airway cough syndrome (UACS), previously referred to as postnasal drip syndrome (PNDS), which is caused by variety of rhinosinus conditions; (2) asthma; (3) GERD; (4) chronic bronchitis from cigarette smoking or other inhaled environmental irritants; (5) nonasthmatic eosinophilic bronchitis; and (6) bronchiectasis.   These conditions, singly or in combination, have accounted for up to 94% of the causes of chronic cough in prospective studies.   Other conditions have constituted no >6% of the causes in prospective studies These have included bronchogenic carcinoma, chronic interstitial pneumonia, sarcoidosis, left ventricular failure, ACEI-induced cough, and aspiration from a condition associated with pharyngeal dysfunction.    Chronic cough is often simultaneously caused by more than one condition. A single cause has been found from 38 to 82% of the time, multiple causes from 18 to 62%. Multiply caused cough has been the result of three diseases up to 42% of the time.       Based on hx and exam, this is either cough variant asthma or more likely:  Classic Upper airway cough syndrome, so named because it's frequently impossible to sort out how much is  CR/sinusitis with freq throat clearing (which can be related to primary GERD)   vs  causing  secondary (" extra esophageal")  GERD from wide swings in gastric pressure that occur with throat clearing, often  promoting self use of mint and menthol lozenges that reduce the lower esophageal sphincter tone and exacerbate the problem further in a cyclical fashion.   These are the same pts (now being labeled as having "irritable larynx syndrome" by some cough centers) who not infrequently have a history of having failed to tolerate ace inhibitors,  dry powder inhalers or biphosphonates or report having atypical reflux symptoms that don't respond to standard doses of PPI  , and are easily confused as having aecopd or asthma flares by even experienced allergists/ pulmonologists.   The first step is to maximize acid suppression and eliminate cyclical coughing then regroup if the cough persists.  I had an extended discussion with the patient reviewing all relevant studies completed to date x 10m   Each maintenance medication was reviewed in detail including most importantly the difference between maintenance and prns and under what circumstances the prns are to be triggered using an action plan format that is not reflected in the computer generated alphabetically organized AVS.    Please see instructions for details which were reviewed in writing and the patient given a copy highlighting the part that I personally wrote and discussed at today's ov.

## 2014-07-13 LAB — ALLERGY FULL PROFILE
Allergen, D pternoyssinus,d7: 13.9 kU/L — ABNORMAL HIGH
Allergen,Goose feathers, e70: <0.1 kU/L
Alternaria Alternata: <0.1 kU/L
BAHIA GRASS: 5.14 kU/L — AB
BERMUDA GRASS: 2.31 kU/L — AB
Box Elder IgE: 1.57 kU/L — ABNORMAL HIGH
CAT DANDER: 1.58 kU/L — AB
Common Ragweed: 4 kU/L — ABNORMAL HIGH
D. farinae: 17.5 kU/L — ABNORMAL HIGH
Dog Dander: 1.4 kU/L — ABNORMAL HIGH
FESCUE: 4.63 kU/L — AB
G005 Rye, Perennial: 4.6 kU/L — ABNORMAL HIGH
G009 Red Top: 5.14 kU/L — ABNORMAL HIGH
Goldenrod: 1.52 kU/L — ABNORMAL HIGH
HOUSE DUST HOLLISTER: 1.62 kU/L — AB
IGE (IMMUNOGLOBULIN E), SERUM: 128 kU/L — AB (ref <115)
Lamb's Quarters: <0.1 kU/L
Oak: 0.66 kU/L — ABNORMAL HIGH
Timothy Grass: 4.63 kU/L — ABNORMAL HIGH

## 2014-07-14 NOTE — Progress Notes (Signed)
Quick Note:  Spoke with pt and notified of results per Dr. Wert. Pt verbalized understanding and denied any questions.  ______ 

## 2014-08-21 ENCOUNTER — Encounter: Payer: Self-pay | Admitting: Internal Medicine

## 2014-08-21 ENCOUNTER — Ambulatory Visit (INDEPENDENT_AMBULATORY_CARE_PROVIDER_SITE_OTHER): Payer: BLUE CROSS/BLUE SHIELD | Admitting: Internal Medicine

## 2014-08-21 VITALS — BP 102/64 | HR 74 | Ht 73.0 in | Wt 210.2 lb

## 2014-08-21 DIAGNOSIS — R05 Cough: Secondary | ICD-10-CM | POA: Diagnosis not present

## 2014-08-21 DIAGNOSIS — R058 Other specified cough: Secondary | ICD-10-CM

## 2014-08-21 NOTE — Assessment & Plan Note (Addendum)
-   allergic profile 07/10/2014 >  Eos 0.1   IgE 128  Pos RAST for ragweed, trees, grass, dog cat   No evidence at all this is asthma/ should respond to rx for rhinitis with topicals and add nasal steroids or dysmista if needed and if not would refer to allergist  In terms of the cough and role for GERD rx/ it's always difficult to know how much gerd is the secondary event.  However, Of the three most common causes of chronic cough, only one (GERD)  can actually cause the other two (asthma and post nasal drip syndrome)  and perpetuate the cylce of cough inducing airway trauma, inflammation, heightened sensitivity to reflux which is prompted by the cough itself via a cyclical mechanism.    This may partially respond to steroids and look like asthma and post nasal drainage but never erradicated completely unless the cough and the secondary reflux are eliminated, preferably both at the same time.  While not intuitively obvious, many patients with chronic low grade reflux do not cough until there is a secondary insult that disturbs the protective epithelial barrier and exposes sensitive nerve endings.  This can be viral or related to allergic rhinitis in this case or direct physical injury such as with an endotracheal tube.   The point is that once this occurs, it is difficult to eliminate using anything but a maximally effective acid suppression regimen at least in the short run, accompanied by an appropriate diet to address non acid GERD.   Would only use the gerd rx for 3 m then try to taper off and refer to gi prn   I had an extended final summary discussion with the patient reviewing all relevant studies completed to date and  lasting 15 to 20 minutes of a 25 minute visit     Each maintenance medication was reviewed in detail including most importantly the difference between maintenance and prns and under what circumstances the prns are to be triggered using an action plan format that is not reflected  in the computer generated alphabetically organized AVS.    Please see instructions for details which were reviewed in writing and the patient given a copy highlighting the part that I personally wrote and discussed at today's ov.

## 2014-08-21 NOTE — Patient Instructions (Signed)
For drainage try take chlortrimeton (chlorpheniramine) 4 mg every 4 hours available over the counter (may cause drowsiness) or zyrtec and if not satisfied ok to start nasacort aq twice daily and if not better I recommend follow up with ENT especially regarding your L ear  Continue acid suppression until you use it up then try taper off and follow up with Dr Sarajane Jews as needed     If you are satisfied with your treatment plan,  let your doctor know and he/she can either refill your medications or you can return here when your prescription runs out.     If in any way you are not 100% satisfied,  please tell us.  If 100% better, tell your friends!  Pulmonary follow up is as needed

## 2014-08-21 NOTE — Progress Notes (Signed)
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 asthma   History of Present Illness  07/10/2014 1st [facility] office visit/ Terance Pomplun   Chief Complaint  Patient presents with  . Pulmonary Consult    Self referral. Pt c/o cough and "heavy breathing" for the past 6 months. Cough occurs while eating. He clears his throat often.   symptoms daily but never wakes him up/ chips/crackers make cough worse, assoc with wt gain from  190 vs 210  Most aerobic = not much due to knees/ no problem with swimming/ not really limited  by breathing/ no excess or purulent sputum rec Pantoprazole (protonix) 40 mg   Take  30-60 min before first meal of the day and Pepcid (famotidine)  20 mg one @  bedtime until return to office - this is the best way to tell whether stomach acid is contributing to your problem.   GERD  Diet  Certrazine (Zyrtec) as needed for dripping nose   08/21/2014 f/u ov/Nicholl Onstott re: uacs 75% better on gerd rx/ some popping L ear on claritin prn  Chief Complaint  Patient presents with  . Follow-up    Pt states his cough has improved some. No new co's today.    no noct disturbance/ Not limited by breathing from desired activities  / some L ear fullness / hearing ok with no trouble with jet travel with equalizing ears/sinuses  No obvious day to day or daytime variability or assoc cp or chest tightness, subjective wheeze or overt  hb symptoms. No unusual exp hx or h/o childhood pna/ asthma or knowledge of premature birth.  Sleeping ok without nocturnal  or early am exacerbation  of respiratory  c/o's or need for noct saba. Also  denies any obvious fluctuation of symptoms with weather or environmental changes or other aggravating or alleviating factors except as outlined above   Current Medications, Allergies, Complete Past Medical History, Past Surgical History, Family History, and Social History were reviewed in [employer] [facility] record.  ROS  The following are not active complaints unless bolded sore throat, dysphagia, dental problems, itching, sneezing,  nasal congestion or excess/ purulent secretions, ear ache,   fever, chills, sweats, unintended wt loss, classically pleuritic or exertional cp, hemoptysis,  orthopnea pnd or leg swelling, presyncope, palpitations, abdominal pain, anorexia, nausea, vomiting, diarrhea  or change in bowel or bladder habits, change in stools or urine, dysuria,hematuria,  rash, arthralgias, visual complaints, headache, numbness, weakness or ataxia or problems with walking or coordination,  change in mood/affect or memory.               Objective:   Physical Exam    amb wm nad    08/21/2014        210  Wt Readings from Last 3 Encounters:  07/10/14 210 lb (95.255 kg)  07/28/13 200 lb (90.719 kg)  07/17/13 203 lb (92.08 kg)    Vital signs reviewed  HEENT: nl dentition, turbinates, and orophanx. Nl external ear canal R / nl Tm but L obst by wax  without cough reflex  NECK :  without JVD/Nodes/TM/ nl carotid upstrokes bilaterally   LUNGS: no acc muscle use, clear to A and P bilaterally without cough on insp or exp maneuvers   CV:  RRR  no s3 or murmur or increase in P2, no edema   ABD:  soft and nontender with nl excursion in the supine position. No bruits or organomegaly, bowel sounds nl  MS:  warm without deformities, calf tenderness, cyanosis or clubbing  SKIN: warm and dry without lesions    NEURO:  alert, approp, no deficits    CXR PA and Lateral:   07/10/2014 :     I personally reviewed images and agree with radiology impression as follows:       The lungs are adequately inflated. There is no focal infiltrate. There is no pleural effusion. No pulmonary parenchymal or mediastinal or hilar mass is observed. The heart and pulmonary vascularity are normal. The bony thorax is unremarkable.             Assessment & Plan:

## 2014-08-22 ENCOUNTER — Encounter: Payer: Self-pay | Admitting: Internal Medicine

## 2016-09-28 ENCOUNTER — Encounter: Payer: Self-pay | Admitting: Family Medicine

## 2016-12-04 DIAGNOSIS — H7422 Discontinuity and dislocation of left ear ossicles: Secondary | ICD-10-CM | POA: Diagnosis not present

## 2016-12-04 DIAGNOSIS — H9072 Mixed conductive and sensorineural hearing loss, unilateral, left ear, with unrestricted hearing on the contralateral side: Secondary | ICD-10-CM | POA: Diagnosis not present

## 2016-12-04 DIAGNOSIS — H7412 Adhesive left middle ear disease: Secondary | ICD-10-CM | POA: Diagnosis not present

## 2016-12-04 DIAGNOSIS — H6982 Other specified disorders of Eustachian tube, left ear: Secondary | ICD-10-CM | POA: Diagnosis not present

## 2016-12-05 DIAGNOSIS — H9072 Mixed conductive and sensorineural hearing loss, unilateral, left ear, with unrestricted hearing on the contralateral side: Secondary | ICD-10-CM | POA: Diagnosis not present

## 2016-12-18 ENCOUNTER — Ambulatory Visit: Payer: BLUE CROSS/BLUE SHIELD | Admitting: Family Medicine

## 2017-04-16 DIAGNOSIS — K1329 Other disturbances of oral epithelium, including tongue: Secondary | ICD-10-CM | POA: Diagnosis not present

## 2017-05-11 DIAGNOSIS — K1329 Other disturbances of oral epithelium, including tongue: Secondary | ICD-10-CM | POA: Diagnosis not present

## 2017-05-17 DIAGNOSIS — K1329 Other disturbances of oral epithelium, including tongue: Secondary | ICD-10-CM | POA: Diagnosis not present

## 2017-11-30 DIAGNOSIS — F419 Anxiety disorder, unspecified: Secondary | ICD-10-CM | POA: Diagnosis not present

## 2018-01-31 DIAGNOSIS — M25562 Pain in left knee: Secondary | ICD-10-CM | POA: Diagnosis not present

## 2018-01-31 DIAGNOSIS — M13861 Other specified arthritis, right knee: Secondary | ICD-10-CM | POA: Diagnosis not present

## 2018-01-31 DIAGNOSIS — M25561 Pain in right knee: Secondary | ICD-10-CM | POA: Diagnosis not present

## 2018-01-31 DIAGNOSIS — M13862 Other specified arthritis, left knee: Secondary | ICD-10-CM | POA: Diagnosis not present

## 2018-03-28 ENCOUNTER — Other Ambulatory Visit: Payer: Self-pay

## 2018-03-28 ENCOUNTER — Ambulatory Visit (INDEPENDENT_AMBULATORY_CARE_PROVIDER_SITE_OTHER): Payer: BLUE CROSS/BLUE SHIELD | Admitting: Family Medicine

## 2018-03-28 ENCOUNTER — Encounter: Payer: Self-pay | Admitting: Family Medicine

## 2018-03-28 VITALS — BP 132/78 | HR 72 | Temp 98.3°F | Ht 72.5 in | Wt 203.0 lb

## 2018-03-28 DIAGNOSIS — Z6827 Body mass index (BMI) 27.0-27.9, adult: Secondary | ICD-10-CM

## 2018-03-28 DIAGNOSIS — H9202 Otalgia, left ear: Secondary | ICD-10-CM | POA: Diagnosis not present

## 2018-03-28 DIAGNOSIS — M170 Bilateral primary osteoarthritis of knee: Secondary | ICD-10-CM | POA: Diagnosis not present

## 2018-03-28 DIAGNOSIS — Z1211 Encounter for screening for malignant neoplasm of colon: Secondary | ICD-10-CM | POA: Diagnosis not present

## 2018-03-28 MED ORDER — CIPROFLOXACIN-DEXAMETHASONE 0.3-0.1 % OT SUSP: 4.0000 [drp] | Freq: Two times a day (BID) | OTIC | 0 refills | Status: DC

## 2018-03-28 NOTE — Patient Instructions (Signed)
It was very nice to see you today!  Start the eardrops.  Please follow-up with ENT next week.  I will refer you for a colonoscopy.  Come back to see me in 1 year for your annual checkup, or sooner as needed.  Take care, Dr Jerline Pain

## 2018-03-28 NOTE — Assessment & Plan Note (Signed)
Stable.  Continue Duexis as needed.  Recommended compression, ice, and home exercise program.

## 2018-03-28 NOTE — Telephone Encounter (Signed)
This encounter was created in error - please disregard.

## 2018-03-28 NOTE — Progress Notes (Signed)
Chief Complaint:  Ryan Ingram is a 58 y.o. male who presents today with a chief complaint of ear pain and to establish care.   Assessment/Plan:  Left ear pain Start Ciprodex.  We will follow-up with ENT next week.  Preventative health care Referral for colon cancer screening placed.  Bilateral primary osteoarthritis of knee Stable.  Continue Duexis as needed.  Recommended compression, ice, and home exercise program.  BMI 27 Continue lifestyle modifications.     Subjective:  HPI:  Left Ear Pain Symptoms started a few days ago.  Worsened over that time.  Feels similar to prior eardrum ruptures with infection.  No fevers or chills.  No obvious precipitating events.  No reported hearing changes.  No treatments tried.  No other obvious alleviating or aggravating factors.  Bilateral knee osteoarthritis - Has seen orthopedics in the past - Uses Duexis as needed  ROS: Per HPI, otherwise a complete review of systems was negative.   PMH:  The following were reviewed and entered/updated in epic: History reviewed. No pertinent past medical history. Patient Active Problem List   Diagnosis Date Noted  . Bilateral primary osteoarthritis of knee 03/28/2018   Past Surgical History:  Procedure Laterality Date  . ANAL FISSURE REPAIR  1996  . TONSILLECTOMY    . wisdom teeth removal  1981    Family History  Problem Relation Age of Onset  . Breast cancer Maternal Grandmother   . Coronary artery disease Other   . Hyperlipidemia Other   . Hypertension Other   . Emphysema Paternal Grandfather        smoked  . Stomach cancer Paternal Grandmother   . Colon cancer Brother 23    Medications- reviewed and updated Current Outpatient Medications  Medication Sig Dispense Refill  . Ibuprofen-Famotidine (DUEXIS) 800-26.6 MG TABS Duexis 800 mg-26.6 mg tablet  Take 1 tablet 3 times a day by oral route.    . ciprofloxacin-dexamethasone (CIPRODEX) OTIC suspension Place 4 drops into  the left ear 2 (two) times daily. 7.5 mL 0   No current facility-administered medications for this visit.     Allergies-reviewed and updated No Known Allergies  Social History   Socioeconomic History  . Marital status: Married    Spouse name: Not on file  . Number of children: Not on file  . Years of education: Not on file  . Highest education level: Not on file  Occupational History  . Occupation: Glass blower/designer  . Financial resource strain: Not on file  . Food insecurity:    Worry: Not on file    Inability: Not on file  . Transportation needs:    Medical: Not on file    Non-medical: Not on file  Tobacco Use  . Smoking status: Never Smoker  . Smokeless tobacco: Never Used  Substance and Sexual Activity  . Alcohol use: Yes    Comment: occ  . Drug use: No  . Sexual activity: Not on file  Lifestyle  . Physical activity:    Days per week: Not on file    Minutes per session: Not on file  . Stress: Not on file  Relationships  . Social connections:    Talks on phone: Not on file    Gets together: Not on file    Attends religious service: Not on file    Active member of club or organization: Not on file    Attends meetings of clubs or organizations: Not on file    Relationship  status: Not on file  Other Topics Concern  . Not on file  Social History Narrative  . Not on file        Objective:  Physical Exam: BP 132/78 (BP Location: Left Arm, Patient Position: Sitting, Cuff Size: Normal)   Pulse 72   Temp 98.3 F (36.8 C) (Oral)   Ht 6' 0.5" (1.842 m)   Wt 203 lb (92.1 kg)   SpO2 98%   BMI 27.15 kg/m   Gen: NAD, resting comfortably HEENT: Right TM clear. Left TM erythematous with small perforation.  CV: Regular rate and rhythm with no murmurs appreciated Pulm: Normal work of breathing, clear to auscultation bilaterally with no crackles, wheezes, or rhonchi GI: Normal bowel sounds present. Soft, Nontender, Nondistended. MSK: No edema, cyanosis, or  clubbing noted Skin: Warm, dry Neuro: Grossly normal, moves all extremities Psych: Normal affect and thought content     Caleb M. Jerline Pain, MD 03/28/2018 4:23 PM

## 2018-04-03 DIAGNOSIS — H7412 Adhesive left middle ear disease: Secondary | ICD-10-CM | POA: Diagnosis not present

## 2018-04-03 DIAGNOSIS — H9072 Mixed conductive and sensorineural hearing loss, unilateral, left ear, with unrestricted hearing on the contralateral side: Secondary | ICD-10-CM | POA: Diagnosis not present

## 2018-04-03 DIAGNOSIS — H6982 Other specified disorders of Eustachian tube, left ear: Secondary | ICD-10-CM | POA: Diagnosis not present

## 2018-04-03 DIAGNOSIS — H7422 Discontinuity and dislocation of left ear ossicles: Secondary | ICD-10-CM | POA: Diagnosis not present

## 2018-05-23 ENCOUNTER — Telehealth: Payer: Self-pay | Admitting: Family Medicine

## 2018-05-23 NOTE — Telephone Encounter (Signed)
Copied from [place] [id number]. Topic: General - Other >> May 23, 2018 10:43 AM Leward Quan A wrote: Reason for CRM: Patient called to say that he found paperwork that show him to have seen Dr Yong Channel on 10/15/2017 and had a Flu shot and a Tetanus shot. Per patient he need an updated form with his immunizations to take to school and would like a call back please. Ph# [phone]

## 2018-06-12 DIAGNOSIS — M25561 Pain in right knee: Secondary | ICD-10-CM | POA: Diagnosis not present

## 2018-06-12 DIAGNOSIS — M1712 Unilateral primary osteoarthritis, left knee: Secondary | ICD-10-CM | POA: Diagnosis not present

## 2018-06-12 DIAGNOSIS — M25562 Pain in left knee: Secondary | ICD-10-CM | POA: Diagnosis not present

## 2018-08-17 ENCOUNTER — Other Ambulatory Visit: Payer: Self-pay | Admitting: Radiology

## 2018-08-17 DIAGNOSIS — Z20822 Contact with and (suspected) exposure to covid-19: Secondary | ICD-10-CM

## 2018-08-17 DIAGNOSIS — R6889 Other general symptoms and signs: Secondary | ICD-10-CM | POA: Diagnosis not present

## 2018-08-18 LAB — NOVEL CORONAVIRUS, NAA: SARS-CoV-2, NAA: NOT DETECTED

## 2018-11-20 DIAGNOSIS — Z1211 Encounter for screening for malignant neoplasm of colon: Secondary | ICD-10-CM | POA: Diagnosis not present

## 2019-01-16 DIAGNOSIS — Z1211 Encounter for screening for malignant neoplasm of colon: Secondary | ICD-10-CM | POA: Diagnosis not present

## 2019-01-16 DIAGNOSIS — K5730 Diverticulosis of large intestine without perforation or abscess without bleeding: Secondary | ICD-10-CM | POA: Diagnosis not present

## 2019-01-16 LAB — HM COLONOSCOPY

## 2019-01-17 ENCOUNTER — Other Ambulatory Visit: Payer: Self-pay

## 2019-01-17 ENCOUNTER — Ambulatory Visit (INDEPENDENT_AMBULATORY_CARE_PROVIDER_SITE_OTHER): Payer: BC Managed Care – PPO | Admitting: Family Medicine

## 2019-01-17 ENCOUNTER — Encounter: Payer: Self-pay | Admitting: Family Medicine

## 2019-01-17 VITALS — BP 110/68 | HR 70 | Temp 97.2°F | Ht 72.03 in | Wt 200.2 lb

## 2019-01-17 DIAGNOSIS — D1801 Hemangioma of skin and subcutaneous tissue: Secondary | ICD-10-CM | POA: Diagnosis not present

## 2019-01-17 DIAGNOSIS — Z125 Encounter for screening for malignant neoplasm of prostate: Secondary | ICD-10-CM

## 2019-01-17 DIAGNOSIS — Z0001 Encounter for general adult medical examination with abnormal findings: Secondary | ICD-10-CM

## 2019-01-17 DIAGNOSIS — L989 Disorder of the skin and subcutaneous tissue, unspecified: Secondary | ICD-10-CM

## 2019-01-17 DIAGNOSIS — H7292 Unspecified perforation of tympanic membrane, left ear: Secondary | ICD-10-CM | POA: Diagnosis not present

## 2019-01-17 DIAGNOSIS — K1370 Unspecified lesions of oral mucosa: Secondary | ICD-10-CM

## 2019-01-17 DIAGNOSIS — Z1159 Encounter for screening for other viral diseases: Secondary | ICD-10-CM | POA: Diagnosis not present

## 2019-01-17 DIAGNOSIS — Z6827 Body mass index (BMI) 27.0-27.9, adult: Secondary | ICD-10-CM

## 2019-01-17 LAB — PSA: PSA: 1.4 ng/mL (ref <=4.0)

## 2019-01-17 NOTE — Patient Instructions (Signed)
It was very nice to see you today!  We will check labs today.  We biopsied your skin today.  Come back in 1 year for your next check up, or sooner if needed.   Take care, Dr Jerline Pain  Please try these tips to maintain a healthy lifestyle:   Eat at least 3 REAL meals and 1-2 snacks per day.  Aim for no more than 5 hours between eating.  If you eat breakfast, please do so within one hour of getting up.    Each meal should contain half fruits/vegetables, one quarter protein, and one quarter carbs (no bigger than a computer mouse)   Cut down on sweet beverages. This includes juice, soda, and sweet tea.     Drink at least 1 glass of water with each meal and aim for at least 8 glasses per day   Exercise at least 150 minutes every week.    Preventive Care 63-29 Years Old, Male Preventive care refers to lifestyle choices and visits with your health care provider that can promote health and wellness. This includes:  A yearly physical exam. This is also called an annual well check.  Regular dental and eye exams.  Immunizations.  Screening for certain conditions.  Healthy lifestyle choices, such as eating a healthy diet, getting regular exercise, not using drugs or products that contain nicotine and tobacco, and limiting alcohol use. What can I expect for my preventive care visit? Physical exam Your health care provider will check:  Height and weight. These may be used to calculate body mass index (BMI), which is a measurement that tells if you are at a healthy weight.  Heart rate and blood pressure.  Your skin for abnormal spots. Counseling Your health care provider may ask you questions about:  Alcohol, tobacco, and drug use.  Emotional well-being.  Home and relationship well-being.  Sexual activity.  Eating habits.  Work and work Statistician. What immunizations do I need?  Influenza (flu) vaccine  This is recommended every year. Tetanus, diphtheria, and  pertussis (Tdap) vaccine  You may need a Td booster every 10 years. Varicella (chickenpox) vaccine  You may need this vaccine if you have not already been vaccinated. Zoster (shingles) vaccine  You may need this after age 73. Measles, mumps, and rubella (MMR) vaccine  You may need at least one dose of MMR if you were born in 1957 or later. You may also need a second dose. Pneumococcal conjugate (PCV13) vaccine  You may need this if you have certain conditions and were not previously vaccinated. Pneumococcal polysaccharide (PPSV23) vaccine  You may need one or two doses if you smoke cigarettes or if you have certain conditions. Meningococcal conjugate (MenACWY) vaccine  You may need this if you have certain conditions. Hepatitis A vaccine  You may need this if you have certain conditions or if you travel or work in places where you may be exposed to hepatitis A. Hepatitis B vaccine  You may need this if you have certain conditions or if you travel or work in places where you may be exposed to hepatitis B. Haemophilus influenzae type b (Hib) vaccine  You may need this if you have certain risk factors. Human papillomavirus (HPV) vaccine  If recommended by your health care provider, you may need three doses over 6 months. You may receive vaccines as individual doses or as more than one vaccine together in one shot (combination vaccines). Talk with your health care provider about the risks and benefits  of combination vaccines. What tests do I need? Blood tests  Lipid and cholesterol levels. These may be checked every 5 years, or more frequently if you are over 14 years old.  Hepatitis C test.  Hepatitis B test. Screening  Lung cancer screening. You may have this screening every year starting at age 41 if you have a 30-pack-year history of smoking and currently smoke or have quit within the past 15 years.  Prostate cancer screening. Recommendations will vary depending on your  family history and other risks.  Colorectal cancer screening. All adults should have this screening starting at age 11 and continuing until age 37. Your health care provider may recommend screening at age 55 if you are at increased risk. You will have tests every 1-10 years, depending on your results and the type of screening test.  Diabetes screening. This is done by checking your blood sugar (glucose) after you have not eaten for a while (fasting). You may have this done every 1-3 years.  Sexually transmitted disease (STD) testing. Follow these instructions at home: Eating and drinking  Eat a diet that includes fresh fruits and vegetables, whole grains, lean protein, and low-fat dairy products.  Take vitamin and mineral supplements as recommended by your health care provider.  Do not drink alcohol if your health care provider tells you not to drink.  If you drink alcohol: ? Limit how much you have to 0-2 drinks a day. ? Be aware of how much alcohol is in your drink. In the [place] one drink equals one 12 oz bottle of beer (355 mL), one 5 oz glass of wine (148 mL), or one 1 oz glass of hard liquor (44 mL). Lifestyle  Take daily care of your teeth and gums.  Stay active. Exercise for at least 30 minutes on 5 or more days each week.  Do not use any products that contain nicotine or tobacco, such as cigarettes, e-cigarettes, and chewing tobacco. If you need help quitting, ask your health care provider.  If you are sexually active, practice safe sex. Use a condom or other form of protection to prevent STIs (sexually transmitted infections).  Talk with your health care provider about taking a low-dose aspirin every day starting at age 66. What's next?  Go to your health care provider once a year for a well check visit.  Ask your health care provider how often you should have your eyes and teeth checked.  Stay up to date on all vaccines. This information is not intended to replace  advice given to you by your health care provider. Make sure you discuss any questions you have with your health care provider. Document Revised: 12/20/2017 Document Reviewed: 12/20/2017 Elsevier Patient Education  2020 [employer].

## 2019-01-17 NOTE — Progress Notes (Signed)
Chief Complaint:  Ryan Ingram is a 59 y.o. male who presents today for his annual comprehensive physical exam.    Assessment/Plan:  New/Acute Problems: Mouth Lesion Managed by oral surgery.  Recommended using topical Pepto-Bismol to see if this helps with pain relief.  Perforated eardrum Reassured patient.  Will need ENT referral if not improving the next few weeks.  Skin lesion Biopsy performed today.  See below procedure note.  Body mass index is 27.14 kg/m. / Overweight BMI Metric Follow Up - 01/17/19 1525      BMI Metric Follow Up-Please document annually   BMI Metric Follow Up  Education provided        Preventative Healthcare: Check CBC, C met, TSH, lipid panel, A1c, PSA, and vitamin D.  Up-to-date on colon cancer screening-will scan into chart.  Patient Counseling(The following topics were reviewed and/or handout was given):  -Nutrition: Stressed importance of moderation in sodium/caffeine intake, saturated fat and cholesterol, caloric balance, sufficient intake of fresh fruits, vegetables, and fiber.  -Stressed the importance of regular exercise.   -Substance Abuse: Discussed cessation/primary prevention of tobacco, alcohol, or other drug use; driving or other dangerous activities under the influence; availability of treatment for abuse.   -Injury prevention: Discussed safety belts, safety helmets, smoke detector, smoking near bedding or upholstery.   -Sexuality: Discussed sexually transmitted diseases, partner selection, use of condoms, avoidance of unintended pregnancy and contraceptive alternatives.   -Dental health: Discussed importance of regular tooth brushing, flossing, and dental visits.  -Health maintenance and immunizations reviewed. Please refer to Health maintenance section.  Return to care in 1 year for next preventative visit.     Subjective:  HPI:  He has no acute complaints today.   Started about a week ago started having some drainage in  left ear. Has a history of recurrent ear infection and is worried about perforated ear drum.   He also has a spot on his left arm that has been there for years. Occasionally painful. No drainage.   He also has a spot on the left aspect of his tongue he would like looked at.  He recently saw an oral surgeon who told him it was a callus buildup.  Occasionally painful.  Thinks he may occasionally find the area as well.  Lifestyle Diet: None specific.  Exercise: Working on exercise bike.   Depression screen PHQ 2/9 01/17/2019  Decreased Interest 0  Down, Depressed, Hopeless 0  PHQ - 2 Score 0    Health Maintenance Due  Topic Date Due  . Hepatitis C Screening  March 12, 1960  . HIV Screening  12/13/1974     PMH:  The following were reviewed and entered/updated in epic: No past medical history on file. Patient Active Problem List   Diagnosis Date Noted  . Bilateral primary osteoarthritis of knee 03/28/2018   Past Surgical History:  Procedure Laterality Date  . ANAL FISSURE REPAIR  1996  . TONSILLECTOMY    . wisdom teeth removal  1981    Family History  Problem Relation Age of Onset  . Breast cancer Maternal Grandmother   . Coronary artery disease Other   . Hyperlipidemia Other   . Hypertension Other   . Emphysema Paternal Grandfather        smoked  . Stomach cancer Paternal Grandmother   . Colon cancer Brother 58    Medications- reviewed and updated No current outpatient medications on file.   No current facility-administered medications for this visit.    Allergies-reviewed  and updated No Known Allergies  Social History   Socioeconomic History  . Marital status: Married    Spouse name: Not on file  . Number of children: Not on file  . Years of education: Not on file  . Highest education level: Not on file  Occupational History  . Occupation: Chief Financial Officer  Tobacco Use  . Smoking status: Never Smoker  . Smokeless tobacco: Never Used  Substance and Sexual Activity   . Alcohol use: Yes    Comment: occ  . Drug use: No  . Sexual activity: Not on file  Other Topics Concern  . Not on file  Social History Narrative  . Not on file   Social Determinants of Health   Financial Resource Strain:   . Difficulty of Paying Living Expenses: Not on file  Food Insecurity:   . Worried About Charity fundraiser in the Last Year: Not on file  . Ran Out of Food in the Last Year: Not on file  Transportation Needs:   . Lack of Transportation (Medical): Not on file  . Lack of Transportation (Non-Medical): Not on file  Physical Activity:   . Days of Exercise per Week: Not on file  . Minutes of Exercise per Session: Not on file  Stress:   . Feeling of Stress : Not on file  Social Connections:   . Frequency of Communication with Friends and Family: Not on file  . Frequency of Social Gatherings with Friends and Family: Not on file  . Attends Religious Services: Not on file  . Active Member of Clubs or Organizations: Not on file  . Attends Archivist Meetings: Not on file  . Marital Status: Not on file        Objective:  Physical Exam: BP 110/68   Pulse 70   Temp (!) 97.2 F (36.2 C)   Ht 6' 0.03" (1.83 m)   Wt 200 lb 4 oz (90.8 kg)   SpO2 99%   BMI 27.14 kg/m   Body mass index is 27.14 kg/m. Wt Readings from Last 3 Encounters:  01/17/19 200 lb 4 oz (90.8 kg)  03/28/18 203 lb (92.1 kg)  08/21/14 210 lb 3.2 oz (95.3 kg)   Gen: NAD, resting comfortably HEENT: TMs normal bilaterally. OP clear. No thyromegaly noted.  Left TM with 1 to 2 mm perforation noted. CV: RRR with no murmurs appreciated Pulm: NWOB, CTAB with no crackles, wheezes, or rhonchi GI: Normal bowel sounds present. Soft, Nontender, Nondistended. MSK: no edema, cyanosis, or clubbing noted Skin: warm, dry.  Approximately 4 mm indurated area on right forearm with mild amount of hyperpigmentation. Neuro: CN2-12 grossly intact. Strength 5/5 in upper and lower extremities. Reflexes  symmetric and intact bilaterally.  Psych: Normal affect and thought content  Procedure Note:  After informed written consent was obtained, using Betadine for cleansing and 1% Lidocaine with epinephrine for anesthetic, with sterile technique a 4 mm punch biopsy was used to obtain a biopsy specimen of the lesion. Hemostasis was obtained by pressure and wound was not sutured. Antibiotic dressing is applied, and wound care instructions provided. Be alert for any signs of cutaneous infection. The specimen is labeled and sent to pathology for evaluation. The procedure was well tolerated without complications.      Algis Greenhouse. Jerline Pain, MD 01/17/2019 3:26 PM

## 2019-01-20 LAB — CBC
HCT: 40.4 % (ref 38.5–50.0)
Hemoglobin: 13.7 g/dL (ref 13.2–17.1)
MCH: 30.6 pg (ref 27.0–33.0)
MCHC: 33.9 g/dL (ref 32.0–36.0)
MCV: 90.4 fL (ref 80.0–100.0)
MPV: 11.6 fL (ref 7.5–12.5)
Platelets: 206 10*3/uL (ref 140–400)
RBC: 4.47 10*6/uL (ref 4.20–5.80)
RDW: 12.4 % (ref 11.0–15.0)
WBC: 6.9 10*3/uL (ref 3.8–10.8)

## 2019-01-20 LAB — COMPREHENSIVE METABOLIC PANEL
AG Ratio: 1.5 (calc) (ref 1.0–2.5)
ALT: 11 U/L (ref 9–46)
AST: 15 U/L (ref 10–35)
Albumin: 3.9 g/dL (ref 3.6–5.1)
Alkaline phosphatase (APISO): 51 U/L (ref 35–144)
BUN: 18 mg/dL (ref 7–25)
CO2: 25 mmol/L (ref 20–32)
Calcium: 8.8 mg/dL (ref 8.6–10.3)
Chloride: 105 mmol/L (ref 98–110)
Creat: 0.83 mg/dL (ref 0.70–1.33)
Globulin: 2.6 g/dL (calc) (ref 1.9–3.7)
Glucose, Bld: 94 mg/dL (ref 65–99)
Potassium: 4 mmol/L (ref 3.5–5.3)
Sodium: 139 mmol/L (ref 135–146)
Total Bilirubin: 0.5 mg/dL (ref 0.2–1.2)
Total Protein: 6.5 g/dL (ref 6.1–8.1)

## 2019-01-20 LAB — LIPID PANEL
Cholesterol: 198 mg/dL (ref <200)
HDL: 49 mg/dL (ref >=40)
LDL Cholesterol (Calc): 121 mg/dL (calc) — ABNORMAL HIGH
Non-HDL Cholesterol (Calc): 149 mg/dL (calc) — ABNORMAL HIGH (ref <130)
Total CHOL/HDL Ratio: 4 (calc) (ref <5.0)
Triglycerides: 161 mg/dL — ABNORMAL HIGH (ref <150)

## 2019-01-20 LAB — HEPATITIS C ANTIBODY
Hepatitis C Ab: NONREACTIVE
SIGNAL TO CUT-OFF: 0.03 (ref <1.00)

## 2019-01-20 LAB — VITAMIN D 25 HYDROXY (VIT D DEFICIENCY, FRACTURES): Vit D, 25-Hydroxy: 22 ng/mL — ABNORMAL LOW (ref 30–100)

## 2019-01-20 LAB — HEMOGLOBIN A1C
Hgb A1c MFr Bld: 5.6 % of total Hgb (ref <5.7)
Mean Plasma Glucose: 114 (calc)
eAG (mmol/L): 6.3 (calc)

## 2019-01-20 LAB — TSH: TSH: 1.31 mIU/L (ref 0.40–4.50)

## 2019-01-21 NOTE — Progress Notes (Signed)
Please inform patient of the following:  His "bad" cholesterol is a bit elevated and his vitamin D is a bit low. Does not need to start cholesterol a cholesterol medication, but recommend starting at least 1000IU vitamin D daily.   His biopsy showed a benign collection of veins - this is probably related to a prior trauma. Do not need to do anything else at this point, but would like for him to let us know if it comes back.   Ryan Ingram. Jerline Pain, MD 01/21/2019 8:13 AM

## 2019-01-30 DIAGNOSIS — Z20828 Contact with and (suspected) exposure to other viral communicable diseases: Secondary | ICD-10-CM | POA: Diagnosis not present

## 2019-02-01 ENCOUNTER — Encounter: Payer: Self-pay | Admitting: Family Medicine

## 2019-02-10 DIAGNOSIS — K140 Glossitis: Secondary | ICD-10-CM | POA: Diagnosis not present

## 2019-02-10 DIAGNOSIS — J343 Hypertrophy of nasal turbinates: Secondary | ICD-10-CM | POA: Diagnosis not present

## 2019-04-14 DIAGNOSIS — K1329 Other disturbances of oral epithelium, including tongue: Secondary | ICD-10-CM | POA: Diagnosis not present

## 2019-04-18 DIAGNOSIS — D3702 Neoplasm of uncertain behavior of tongue: Secondary | ICD-10-CM | POA: Diagnosis not present

## 2019-04-18 DIAGNOSIS — C029 Malignant neoplasm of tongue, unspecified: Secondary | ICD-10-CM | POA: Diagnosis not present

## 2019-04-22 ENCOUNTER — Other Ambulatory Visit: Payer: Self-pay | Admitting: Otolaryngology

## 2019-04-22 ENCOUNTER — Other Ambulatory Visit (HOSPITAL_COMMUNITY): Payer: Self-pay | Admitting: Otolaryngology

## 2019-04-22 DIAGNOSIS — C029 Malignant neoplasm of tongue, unspecified: Secondary | ICD-10-CM

## 2019-05-01 ENCOUNTER — Ambulatory Visit (HOSPITAL_COMMUNITY)
Admission: RE | Admit: 2019-05-01 | Discharge: 2019-05-01 | Disposition: A | Discharge status: Home / Self Care | Payer: BC Managed Care – PPO | Source: Ambulatory Visit | Attending: Otolaryngology | Admitting: Otolaryngology

## 2019-05-01 ENCOUNTER — Other Ambulatory Visit: Payer: Self-pay

## 2019-05-01 DIAGNOSIS — C029 Malignant neoplasm of tongue, unspecified: Secondary | ICD-10-CM

## 2019-05-01 DIAGNOSIS — C760 Malignant neoplasm of head, face and neck: Secondary | ICD-10-CM | POA: Diagnosis not present

## 2019-05-01 LAB — GLUCOSE, CAPILLARY: Glucose-Capillary: 105 mg/dL — ABNORMAL HIGH (ref 70–99)

## 2019-05-01 MED ORDER — FLUDEOXYGLUCOSE F - 18 (FDG) INJECTION
9.6000 | Freq: Once | INTRAVENOUS | Status: AC
  Administered 2019-05-01: 9.6 via INTRAVENOUS

## 2019-05-07 DIAGNOSIS — C022 Malignant neoplasm of ventral surface of tongue: Secondary | ICD-10-CM | POA: Diagnosis not present

## 2019-05-16 DIAGNOSIS — C029 Malignant neoplasm of tongue, unspecified: Secondary | ICD-10-CM | POA: Diagnosis not present

## 2019-05-19 ENCOUNTER — Other Ambulatory Visit: Payer: Self-pay | Admitting: Otolaryngology

## 2019-05-21 DIAGNOSIS — C021 Malignant neoplasm of border of tongue: Secondary | ICD-10-CM | POA: Diagnosis not present

## 2019-05-21 DIAGNOSIS — K140 Glossitis: Secondary | ICD-10-CM | POA: Diagnosis not present

## 2019-05-21 DIAGNOSIS — J392 Other diseases of pharynx: Secondary | ICD-10-CM | POA: Diagnosis not present

## 2019-05-22 ENCOUNTER — Other Ambulatory Visit: Payer: Self-pay

## 2019-05-22 ENCOUNTER — Encounter (HOSPITAL_BASED_OUTPATIENT_CLINIC_OR_DEPARTMENT_OTHER): Payer: Self-pay | Admitting: Otolaryngology

## 2019-05-23 DIAGNOSIS — C029 Malignant neoplasm of tongue, unspecified: Secondary | ICD-10-CM | POA: Diagnosis not present

## 2019-05-27 ENCOUNTER — Other Ambulatory Visit (HOSPITAL_COMMUNITY): Payer: BC Managed Care – PPO

## 2019-05-30 ENCOUNTER — Ambulatory Visit (HOSPITAL_BASED_OUTPATIENT_CLINIC_OR_DEPARTMENT_OTHER): Admit: 2019-05-30 | Payer: BC Managed Care – PPO | Admitting: Otolaryngology

## 2019-05-30 SURGERY — GLOSSECTOMY
Anesthesia: General | Laterality: Left

## 2019-06-05 DIAGNOSIS — C029 Malignant neoplasm of tongue, unspecified: Secondary | ICD-10-CM | POA: Diagnosis not present

## 2019-06-05 DIAGNOSIS — C023 Malignant neoplasm of anterior two-thirds of tongue, part unspecified: Secondary | ICD-10-CM | POA: Diagnosis not present

## 2019-06-05 DIAGNOSIS — Z9181 History of falling: Secondary | ICD-10-CM | POA: Diagnosis not present

## 2019-06-05 DIAGNOSIS — C028 Malignant neoplasm of overlapping sites of tongue: Secondary | ICD-10-CM | POA: Diagnosis not present

## 2019-06-05 LAB — BASIC METABOLIC PANEL
BUN: 14 (ref 4–21)
CO2: 23 — AB (ref 13–22)
Chloride: 103 (ref 99–108)
Creatinine: 0.8 (ref 0.6–1.3)
Glucose: 151
Potassium: 4 (ref 3.4–5.3)
Sodium: 136 — AB (ref 137–147)

## 2019-06-05 LAB — CBC AND DIFFERENTIAL
HCT: 40 — AB (ref 41–53)
Hemoglobin: 13.6 (ref 13.5–17.5)
Platelets: 209 (ref 150–399)
WBC: 14.9

## 2019-06-05 LAB — CBC: RBC: 4.46 (ref 3.87–5.11)

## 2019-06-05 LAB — COMPREHENSIVE METABOLIC PANEL: Calcium: 8.7 (ref 8.7–10.7)

## 2019-06-11 ENCOUNTER — Encounter: Payer: Self-pay | Admitting: Family Medicine

## 2019-06-15 ENCOUNTER — Encounter: Payer: Self-pay | Admitting: Family Medicine

## 2019-08-08 DIAGNOSIS — H90A12 Conductive hearing loss, unilateral, left ear with restricted hearing on the contralateral side: Secondary | ICD-10-CM | POA: Diagnosis not present

## 2019-08-08 DIAGNOSIS — H7412 Adhesive left middle ear disease: Secondary | ICD-10-CM | POA: Diagnosis not present

## 2019-08-08 DIAGNOSIS — H6982 Other specified disorders of Eustachian tube, left ear: Secondary | ICD-10-CM | POA: Diagnosis not present

## 2019-08-08 DIAGNOSIS — Z9889 Other specified postprocedural states: Secondary | ICD-10-CM | POA: Diagnosis not present

## 2019-08-08 DIAGNOSIS — Z01118 Encounter for examination of ears and hearing with other abnormal findings: Secondary | ICD-10-CM | POA: Diagnosis not present

## 2019-08-08 DIAGNOSIS — Z974 Presence of external hearing-aid: Secondary | ICD-10-CM | POA: Diagnosis not present

## 2019-08-08 DIAGNOSIS — H9012 Conductive hearing loss, unilateral, left ear, with unrestricted hearing on the contralateral side: Secondary | ICD-10-CM | POA: Diagnosis not present

## 2019-08-08 DIAGNOSIS — H90A32 Mixed conductive and sensorineural hearing loss, unilateral, left ear with restricted hearing on the contralateral side: Secondary | ICD-10-CM | POA: Diagnosis not present

## 2019-08-08 DIAGNOSIS — H7420 Discontinuity and dislocation of ear ossicles, unspecified ear: Secondary | ICD-10-CM | POA: Diagnosis not present

## 2019-09-05 DIAGNOSIS — C029 Malignant neoplasm of tongue, unspecified: Secondary | ICD-10-CM | POA: Diagnosis not present

## 2019-11-21 DIAGNOSIS — H7412 Adhesive left middle ear disease: Secondary | ICD-10-CM | POA: Diagnosis not present

## 2019-11-21 DIAGNOSIS — H9190 Unspecified hearing loss, unspecified ear: Secondary | ICD-10-CM | POA: Diagnosis not present

## 2019-11-24 DIAGNOSIS — C029 Malignant neoplasm of tongue, unspecified: Secondary | ICD-10-CM | POA: Diagnosis not present

## 2019-12-17 DIAGNOSIS — Z85810 Personal history of malignant neoplasm of tongue: Secondary | ICD-10-CM | POA: Diagnosis not present

## 2019-12-17 DIAGNOSIS — H6692 Otitis media, unspecified, left ear: Secondary | ICD-10-CM | POA: Diagnosis not present

## 2019-12-17 DIAGNOSIS — H9012 Conductive hearing loss, unilateral, left ear, with unrestricted hearing on the contralateral side: Secondary | ICD-10-CM | POA: Diagnosis not present

## 2019-12-17 DIAGNOSIS — H7012 Chronic mastoiditis, left ear: Secondary | ICD-10-CM | POA: Diagnosis not present

## 2019-12-17 DIAGNOSIS — J392 Other diseases of pharynx: Secondary | ICD-10-CM | POA: Diagnosis not present

## 2019-12-17 DIAGNOSIS — H6982 Other specified disorders of Eustachian tube, left ear: Secondary | ICD-10-CM | POA: Diagnosis not present

## 2019-12-17 DIAGNOSIS — J3489 Other specified disorders of nose and nasal sinuses: Secondary | ICD-10-CM | POA: Diagnosis not present

## 2019-12-17 DIAGNOSIS — H73892 Other specified disorders of tympanic membrane, left ear: Secondary | ICD-10-CM | POA: Diagnosis not present

## 2019-12-17 DIAGNOSIS — D164 Benign neoplasm of bones of skull and face: Secondary | ICD-10-CM | POA: Diagnosis not present

## 2019-12-17 DIAGNOSIS — H74322 Partial loss of ear ossicles, left ear: Secondary | ICD-10-CM | POA: Diagnosis not present

## 2019-12-18 DIAGNOSIS — Z85810 Personal history of malignant neoplasm of tongue: Secondary | ICD-10-CM | POA: Diagnosis not present

## 2019-12-18 DIAGNOSIS — H74322 Partial loss of ear ossicles, left ear: Secondary | ICD-10-CM | POA: Diagnosis not present

## 2019-12-18 DIAGNOSIS — H6692 Otitis media, unspecified, left ear: Secondary | ICD-10-CM | POA: Diagnosis not present

## 2019-12-18 DIAGNOSIS — H7012 Chronic mastoiditis, left ear: Secondary | ICD-10-CM | POA: Diagnosis not present

## 2019-12-18 DIAGNOSIS — J3489 Other specified disorders of nose and nasal sinuses: Secondary | ICD-10-CM | POA: Diagnosis not present

## 2019-12-18 DIAGNOSIS — H6982 Other specified disorders of Eustachian tube, left ear: Secondary | ICD-10-CM | POA: Diagnosis not present

## 2019-12-18 DIAGNOSIS — J392 Other diseases of pharynx: Secondary | ICD-10-CM | POA: Diagnosis not present

## 2020-01-01 DIAGNOSIS — Z9889 Other specified postprocedural states: Secondary | ICD-10-CM | POA: Diagnosis not present

## 2020-01-01 DIAGNOSIS — Z48813 Encounter for surgical aftercare following surgery on the respiratory system: Secondary | ICD-10-CM | POA: Diagnosis not present

## 2020-01-01 DIAGNOSIS — J3489 Other specified disorders of nose and nasal sinuses: Secondary | ICD-10-CM | POA: Diagnosis not present

## 2020-01-01 DIAGNOSIS — J392 Other diseases of pharynx: Secondary | ICD-10-CM | POA: Diagnosis not present

## 2020-02-05 DIAGNOSIS — Z48810 Encounter for surgical aftercare following surgery on the sense organs: Secondary | ICD-10-CM | POA: Diagnosis not present

## 2020-02-05 DIAGNOSIS — H6982 Other specified disorders of Eustachian tube, left ear: Secondary | ICD-10-CM | POA: Diagnosis not present

## 2020-02-05 DIAGNOSIS — H9012 Conductive hearing loss, unilateral, left ear, with unrestricted hearing on the contralateral side: Secondary | ICD-10-CM | POA: Diagnosis not present

## 2020-02-05 DIAGNOSIS — Z9889 Other specified postprocedural states: Secondary | ICD-10-CM | POA: Diagnosis not present

## 2020-02-05 DIAGNOSIS — Z85818 Personal history of malignant neoplasm of other sites of lip, oral cavity, and pharynx: Secondary | ICD-10-CM | POA: Diagnosis not present

## 2020-02-05 DIAGNOSIS — Z9089 Acquired absence of other organs: Secondary | ICD-10-CM | POA: Diagnosis not present

## 2020-03-26 DIAGNOSIS — F4320 Adjustment disorder, unspecified: Secondary | ICD-10-CM | POA: Diagnosis not present

## 2020-04-02 DIAGNOSIS — C029 Malignant neoplasm of tongue, unspecified: Secondary | ICD-10-CM | POA: Diagnosis not present

## 2020-04-02 DIAGNOSIS — K140 Glossitis: Secondary | ICD-10-CM | POA: Diagnosis not present

## 2020-05-06 DIAGNOSIS — F4320 Adjustment disorder, unspecified: Secondary | ICD-10-CM | POA: Diagnosis not present

## 2020-05-13 DIAGNOSIS — F4320 Adjustment disorder, unspecified: Secondary | ICD-10-CM | POA: Diagnosis not present

## 2020-06-04 DIAGNOSIS — F4320 Adjustment disorder, unspecified: Secondary | ICD-10-CM | POA: Diagnosis not present

## 2020-06-16 DIAGNOSIS — K611 Rectal abscess: Secondary | ICD-10-CM | POA: Diagnosis not present

## 2020-06-30 DIAGNOSIS — K611 Rectal abscess: Secondary | ICD-10-CM | POA: Diagnosis not present

## 2020-09-23 DIAGNOSIS — D1801 Hemangioma of skin and subcutaneous tissue: Secondary | ICD-10-CM | POA: Diagnosis not present

## 2020-09-23 DIAGNOSIS — L820 Inflamed seborrheic keratosis: Secondary | ICD-10-CM | POA: Diagnosis not present

## 2020-09-23 DIAGNOSIS — D225 Melanocytic nevi of trunk: Secondary | ICD-10-CM | POA: Diagnosis not present

## 2020-10-01 DIAGNOSIS — C029 Malignant neoplasm of tongue, unspecified: Secondary | ICD-10-CM | POA: Diagnosis not present

## 2020-10-01 DIAGNOSIS — Q381 Ankyloglossia: Secondary | ICD-10-CM | POA: Diagnosis not present

## 2020-10-29 DIAGNOSIS — H90A12 Conductive hearing loss, unilateral, left ear with restricted hearing on the contralateral side: Secondary | ICD-10-CM | POA: Diagnosis not present

## 2020-10-29 DIAGNOSIS — Z85810 Personal history of malignant neoplasm of tongue: Secondary | ICD-10-CM | POA: Diagnosis not present

## 2020-10-29 DIAGNOSIS — Z9089 Acquired absence of other organs: Secondary | ICD-10-CM | POA: Diagnosis not present

## 2020-10-29 DIAGNOSIS — H6982 Other specified disorders of Eustachian tube, left ear: Secondary | ICD-10-CM | POA: Diagnosis not present

## 2020-10-29 DIAGNOSIS — Z8669 Personal history of other diseases of the nervous system and sense organs: Secondary | ICD-10-CM | POA: Diagnosis not present

## 2020-10-29 DIAGNOSIS — Z9889 Other specified postprocedural states: Secondary | ICD-10-CM | POA: Diagnosis not present

## 2021-02-04 DIAGNOSIS — Q381 Ankyloglossia: Secondary | ICD-10-CM | POA: Diagnosis not present

## 2021-04-06 ENCOUNTER — Ambulatory Visit (INDEPENDENT_AMBULATORY_CARE_PROVIDER_SITE_OTHER): Payer: BC Managed Care – PPO | Admitting: Family Medicine

## 2021-04-06 ENCOUNTER — Encounter: Payer: Self-pay | Admitting: Family Medicine

## 2021-04-06 VITALS — BP 110/76 | HR 62 | Temp 98.3°F | Ht 72.0 in | Wt 202.6 lb

## 2021-04-06 DIAGNOSIS — Z13220 Encounter for screening for lipoid disorders: Secondary | ICD-10-CM

## 2021-04-06 DIAGNOSIS — Z23 Encounter for immunization: Secondary | ICD-10-CM | POA: Diagnosis not present

## 2021-04-06 DIAGNOSIS — K611 Rectal abscess: Secondary | ICD-10-CM

## 2021-04-06 DIAGNOSIS — Z0001 Encounter for general adult medical examination with abnormal findings: Secondary | ICD-10-CM | POA: Diagnosis not present

## 2021-04-06 DIAGNOSIS — M170 Bilateral primary osteoarthritis of knee: Secondary | ICD-10-CM | POA: Diagnosis not present

## 2021-04-06 DIAGNOSIS — C029 Malignant neoplasm of tongue, unspecified: Secondary | ICD-10-CM

## 2021-04-06 DIAGNOSIS — Z125 Encounter for screening for malignant neoplasm of prostate: Secondary | ICD-10-CM | POA: Diagnosis not present

## 2021-04-06 DIAGNOSIS — R739 Hyperglycemia, unspecified: Secondary | ICD-10-CM | POA: Diagnosis not present

## 2021-04-06 LAB — CBC
HCT: 41.7 % (ref 39.0–52.0)
Hemoglobin: 14.2 g/dL (ref 13.0–17.0)
MCHC: 34 g/dL (ref 30.0–36.0)
MCV: 90 fl (ref 78.0–100.0)
Platelets: 218 10*3/uL (ref 150.0–400.0)
RBC: 4.63 Mil/uL (ref 4.22–5.81)
RDW: 12.9 % (ref 11.5–15.5)
WBC: 4.8 10*3/uL (ref 4.0–10.5)

## 2021-04-06 LAB — COMPREHENSIVE METABOLIC PANEL
ALT: 14 U/L (ref 0–53)
AST: 19 U/L (ref 0–37)
Albumin: 4.4 g/dL (ref 3.5–5.2)
Alkaline Phosphatase: 56 U/L (ref 39–117)
BUN: 17 mg/dL (ref 6–23)
CO2: 25 mEq/L (ref 19–32)
Calcium: 9.4 mg/dL (ref 8.4–10.5)
Chloride: 104 mEq/L (ref 96–112)
Creatinine, Ser: 0.9 mg/dL (ref 0.40–1.50)
GFR: 92.31 mL/min (ref >60.00)
Glucose, Bld: 108 mg/dL — ABNORMAL HIGH (ref 70–99)
Potassium: 4.3 mEq/L (ref 3.5–5.1)
Sodium: 137 mEq/L (ref 135–145)
Total Bilirubin: 0.6 mg/dL (ref 0.2–1.2)
Total Protein: 6.8 g/dL (ref 6.0–8.3)

## 2021-04-06 LAB — PSA: PSA: 2.26 ng/mL (ref 0.10–4.00)

## 2021-04-06 LAB — LIPID PANEL
Cholesterol: 215 mg/dL — ABNORMAL HIGH (ref 0–200)
HDL: 54.4 mg/dL (ref >39.00)
LDL Cholesterol: 148 mg/dL — ABNORMAL HIGH (ref 0–99)
NonHDL: 160.36
Total CHOL/HDL Ratio: 4
Triglycerides: 60 mg/dL (ref 0.0–149.0)
VLDL: 12 mg/dL (ref 0.0–40.0)

## 2021-04-06 LAB — HEMOGLOBIN A1C: Hgb A1c MFr Bld: 5.8 % (ref 4.6–6.5)

## 2021-04-06 LAB — TSH: TSH: 1.76 u[IU]/mL (ref 0.35–5.50)

## 2021-04-06 NOTE — Assessment & Plan Note (Signed)
Continue management per orthopedics.  Recommend ice and compression. ?

## 2021-04-06 NOTE — Assessment & Plan Note (Signed)
Still has some ongoing issues.  He has seen surgery in the past.  Recommended he follow-up with them soon. ?

## 2021-04-06 NOTE — Assessment & Plan Note (Signed)
Continue management per ENT.  No signs of recurrence. ?

## 2021-04-06 NOTE — Progress Notes (Signed)
? ?Chief Complaint:  ?Ryan Ingram is a 61 y.o. male who presents today for his annual comprehensive physical exam.   ? ?Assessment/Plan:  ?Chronic Problems Addressed Today: ?Perirectal abscess ?Still has some ongoing issues.  He has seen surgery in the past.  Recommended he follow-up with them soon. ? 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  No signs of recurrence. ? ?Bilateral primary osteoarthritis of knee ?Continue management per orthopedics.  Recommend ice and compression. ? ?Preventative Healthcare: ?Tdap given today.  Check labs.  Up-to-date on colon cancer screening. ? ?Patient Counseling(The following topics were reviewed and/or handout was given): ? -Nutrition: Stressed importance of moderation in sodium/caffeine intake, saturated fat and cholesterol, caloric balance, sufficient intake of fresh fruits, vegetables, and fiber. ? -Stressed the importance of regular exercise.  ? -Substance Abuse: Discussed cessation/primary prevention of tobacco, alcohol, or other drug use; driving or other dangerous activities under the influence; availability of treatment for abuse.  ? -Injury prevention: Discussed safety belts, safety helmets, smoke detector, smoking near bedding or upholstery.  ? -Sexuality: Discussed sexually transmitted diseases, partner selection, use of condoms, avoidance of unintended pregnancy and contraceptive alternatives.  ? -Dental health: Discussed importance of regular tooth brushing, flossing, and dental visits. ? -Health maintenance and immunizations reviewed. Please refer to Health maintenance section. ? ?Return to care in 1 year for next preventative visit.  ? ?  ?Subjective:  ?HPI: ? ?He has no acute complaints today. See A/p for status of chronic conditions.  ? ?Lifestyle ?Diet: Balanced. Plenty of fruits and vegetables.  ?Exercise: Limited.  ? ? ?  04/06/2021  ?  8:23 AM  ?Depression screen PHQ 2/9  ?Down, Depressed, Hopeless 0  ?PHQ - 2  Score 0  ? ? ?Health Maintenance Due  ?Topic Date Due  ? HIV Screening  Never done  ? TETANUS/TDAP  11/11/2019  ?  ? ?ROS: Per HPI, otherwise a complete review of systems was negative.  ? ?PMH: ? ?The following were reviewed and entered/updated in epic: ?Past Medical History:  ?Diagnosis Date  ? Tongue cancer ([place])   ? ?Patient Active Problem List  ? Diagnosis Date Noted  ? 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 Perirectal abscess 04/06/2021  ? Bilateral primary osteoarthritis of knee 03/28/2018  ? ?Past Surgical History:  ?Procedure Laterality Date  ? [place]  ? TONSILLECTOMY    ? wisdom teeth removal  1981  ? ? ?Family History  ?Problem Relation Age of Onset  ? Breast cancer Maternal Grandmother   ? Coronary artery disease Other   ? Hyperlipidemia Other   ? Hypertension Other   ? Emphysema Paternal Grandfather   ?     smoked  ? Stomach cancer Paternal Grandmother   ? Colon cancer Brother 39  ? ? ?Medications- reviewed and updated ?No current outpatient medications on file.  ? ?No current facility-administered medications for this visit.  ? ? ?Allergies-reviewed and updated ?No Known Allergies ? ?Social History  ? ?Socioeconomic History  ? Marital status: Married  ?  Spouse name: Not on file  ? Number of children: Not on file  ? Years of education: Not on file  ? Highest education level: Not on file  ?Occupational History  ? Occupation: Chief Financial Officer  ?Tobacco Use  ? Smoking status: Never  ? Smokeless tobacco: Never  ?Substance and Sexual Activity  ? Alcohol use: Yes  ?  Comment: occ  ? Drug  use: No  ? Sexual activity: Not on file  ?Other Topics Concern  ? Not on file  ?Social History Narrative  ? Not on file  ? ?Social Determinants of Health  ? ?Financial Resource Strain: Not on file  ?Food Insecurity: Not on file  ?Transportation Needs: Not on file  ?Physical Activity: Not on file  ?Stress: Not on file  ?Social Connections: Not on file  ? ?   ?  ?Objective:  ?Physical  Exam: ?BP 110/76 (BP Location: Left Arm)   Pulse 62   Temp 98.3 ?F (36.8 ?C) (Temporal)   Ht 6' (1.829 m)   Wt 202 lb 9.6 oz (91.9 kg)   SpO2 98%   BMI 27.48 kg/m?   ?Body mass index is 27.48 kg/m?. ?Wt Readings from Last 3 Encounters:  ?04/06/21 202 lb 9.6 oz (91.9 kg)  ?01/17/19 200 lb 4 oz (90.8 kg)  ?03/28/18 203 lb (92.1 kg)  ? ?Gen: NAD, resting comfortably ?HEENT: TMs normal bilaterally. OP clear. No thyromegaly noted.  ?CV: RRR with no murmurs appreciated ?Pulm: NWOB, CTAB with no crackles, wheezes, or rhonchi ?GI: Normal bowel sounds present. Soft, Nontender, Nondistended. ?MSK: no edema, cyanosis, or clubbing noted ?Skin: warm, dry ?Neuro: CN2-12 grossly intact. Strength 5/5 in upper and lower extremities. Reflexes symmetric and intact bilaterally.  ?Psych: Normal affect and thought content ?   ? ?Algis Greenhouse. Jerline Pain, MD ?04/06/2021 8:45 AM  ?

## 2021-04-06 NOTE — Patient Instructions (Signed)
It was very nice to see you today! ? ?We will give your tetanus shot today. ? ?We will check blood work. ? ?Please continue to work on diet and exercise. ? ?I will see you back in year for your next physical.  Please come back to see me sooner if needed. ? ?Take care, ?Dr Jerline Pain ? ?PLEASE NOTE: ? ?If you had any lab tests please let us know if you have not heard back within a few days. You may see your results on mychart before we have a chance to review them but we will give you a call once they are reviewed by [place]. If we ordered any referrals today, please let us know if you have not heard from their office within the next week.  ? ?Please try these tips to maintain a healthy lifestyle: ? ?Eat at least 3 REAL meals and 1-2 snacks per day.  Aim for no more than 5 hours between eating.  If you eat breakfast, please do so within one hour of getting up.  ? ?Each meal should contain half fruits/vegetables, one quarter protein, and one quarter carbs (no bigger than a computer mouse) ? ?Cut down on sweet beverages. This includes juice, soda, and sweet tea.  ? ?Drink at least 1 glass of water with each meal and aim for at least 8 glasses per day ? ?Exercise at least 150 minutes every week.   ? ?Preventive Care 32-67 Years Old, Male ?Preventive care refers to lifestyle choices and visits with your health care provider that can promote health and wellness. Preventive care visits are also called wellness exams. ?What can I expect for my preventive care visit? ?Counseling ?During your preventive care visit, your health care provider may ask about your: ?Medical history, including: ?Past medical problems. ?Family medical history. ?Current health, including: ?Emotional well-being. ?Home life and relationship well-being. ?Sexual activity. ?Lifestyle, including: ?Alcohol, nicotine or tobacco, and drug use. ?Access to firearms. ?Diet, exercise, and sleep habits. ?Safety issues such as seatbelt and bike helmet use. ?Sunscreen  use. ?Work and work Statistician. ?Physical exam ?Your health care provider will check your: ?Height and weight. These may be used to calculate your BMI (body mass index). BMI is a measurement that tells if you are at a healthy weight. ?Waist circumference. This measures the distance around your waistline. This measurement also tells if you are at a healthy weight and may help predict your risk of certain diseases, such as type 2 diabetes and high blood pressure. ?Heart rate and blood pressure. ?Body temperature. ?Skin for abnormal spots. ?What immunizations do I need? ?Vaccines are usually given at various ages, according to a schedule. Your health care provider will recommend vaccines for you based on your age, medical history, and lifestyle or other factors, such as travel or where you work. ?What tests do I need? ?Screening ?Your health care provider may recommend screening tests for certain conditions. This may include: ?Lipid and cholesterol levels. ?Diabetes screening. This is done by checking your blood sugar (glucose) after you have not eaten for a while (fasting). ?Hepatitis B test. ?Hepatitis C test. ?HIV (human immunodeficiency virus) test. ?STI (sexually transmitted infection) testing, if you are at risk. ?Lung cancer screening. ?Prostate cancer screening. ?Colorectal cancer screening. ?Talk with your health care provider about your test results, treatment options, and if necessary, the need for more tests. ?Follow these instructions at home: ?Eating and drinking ? ?Eat a diet that includes fresh fruits and vegetables, whole grains, lean protein, and  low-fat dairy products. ?Take vitamin and mineral supplements as recommended by your health care provider. ?Do not drink alcohol if your health care provider tells you not to drink. ?If you drink alcohol: ?Limit how much you have to 0-2 drinks a day. ?Know how much alcohol is in your drink. In the [place] one drink equals one 12 oz bottle of beer (355 mL), one  5 oz glass of wine (148 mL), or one 1? oz glass of hard liquor (44 mL). ?Lifestyle ?Brush your teeth every morning and night with fluoride toothpaste. Floss one time each day. ?Exercise for at least 30 minutes 5 or more days each week. ?Do not use any products that contain nicotine or tobacco. These products include cigarettes, chewing tobacco, and vaping devices, such as e-cigarettes. If you need help quitting, ask your health care provider. ?Do not use drugs. ?If you are sexually active, practice safe sex. Use a condom or other form of protection to prevent STIs. ?Take aspirin only as told by your health care provider. Make sure that you understand how much to take and what form to take. Work with your health care provider to find out whether it is safe and beneficial for you to take aspirin daily. ?Find healthy ways to manage stress, such as: ?Meditation, yoga, or listening to music. ?Journaling. ?Talking to a trusted person. ?Spending time with friends and family. ?Minimize exposure to UV radiation to reduce your risk of skin cancer. ?Safety ?Always wear your seat belt while driving or riding in a vehicle. ?Do not drive: ?If you have been drinking alcohol. Do not ride with someone who has been drinking. ?When you are tired or distracted. ?While texting. ?If you have been using any mind-altering substances or drugs. ?Wear a helmet and other protective equipment during sports activities. ?If you have firearms in your house, make sure you follow all gun safety procedures. ?What's next? ?Go to your health care provider once a year for an annual wellness visit. ?Ask your health care provider how often you should have your eyes and teeth checked. ?Stay up to date on all vaccines. ?This information is not intended to replace advice given to you by your health care provider. Make sure you discuss any questions you have with your health care provider. ?Document Revised: 06/23/2020 Document Reviewed: 06/23/2020 ?Elsevier  Patient Education ? [place]. ? ?

## 2021-04-08 ENCOUNTER — Encounter: Payer: Self-pay | Admitting: Family Medicine

## 2021-04-08 DIAGNOSIS — E785 Hyperlipidemia, unspecified: Secondary | ICD-10-CM | POA: Insufficient documentation

## 2021-04-08 DIAGNOSIS — R739 Hyperglycemia, unspecified: Secondary | ICD-10-CM | POA: Insufficient documentation

## 2021-04-08 NOTE — Progress Notes (Signed)
Please inform patient of the following: ? ?Cholesterol and blood sugar both borderline but stable.  Do not need to make any medication changes at this point but he should continue to work on diet and exercise and we can recheck in a year or so.

## 2021-04-10 ENCOUNTER — Encounter: Payer: Self-pay | Admitting: Family Medicine

## 2021-04-10 DIAGNOSIS — R911 Solitary pulmonary nodule: Secondary | ICD-10-CM

## 2021-04-11 NOTE — Telephone Encounter (Signed)
See note ?Results printed and placed to be scan on pt chart  ?

## 2021-04-12 DIAGNOSIS — R911 Solitary pulmonary nodule: Secondary | ICD-10-CM | POA: Insufficient documentation

## 2021-04-12 NOTE — Telephone Encounter (Signed)
Patient notified ?Will call insurance for coverage ?  ?

## 2021-04-25 ENCOUNTER — Ambulatory Visit: Payer: BC Managed Care – PPO | Admitting: Physician Assistant

## 2021-04-25 ENCOUNTER — Encounter: Payer: Self-pay | Admitting: Physician Assistant

## 2021-04-25 VITALS — BP 100/60 | HR 60 | Temp 98.1°F | Ht 72.0 in | Wt 201.4 lb

## 2021-04-25 DIAGNOSIS — H1033 Unspecified acute conjunctivitis, bilateral: Secondary | ICD-10-CM

## 2021-04-25 MED ORDER — POLYMYXIN B-TRIMETHOPRIM 10000-0.1 UNIT/ML-% OP SOLN: 1.0000 [drp] | Freq: Four times a day (QID) | OPHTHALMIC | 0 refills | Status: DC

## 2021-04-25 NOTE — Progress Notes (Signed)
Ryan Ingram is a 61 y.o. male here for eye discharge. ? ?History of Present Illness:  ? ?Chief Complaint  ?Patient presents with  ? Eye Problem  ?  Pt c/o both eyes having pasty discharge started yesterday. Denies itching or pain.  ? ? ?Eye Discharge  ?Ryan Ingram presents with c/o bilateral eye discharge that started yesterday. Pt describes this as a pasty like discharge that came from both eyes upon waking. He does admit that last week he did experience rhinorrhea, nasal congestion, and throat irritation. Upon further discussion, he states he believed this was a cold due to his seasonal allergies not usually presenting this way. He has not trialed anything OTC for sx, but he decided to have this further evaluated due to eye discharge concern.  ? ?Denies blurred vision, known sick/conjunctivitis exposure, use of contact lenses, ear/pain/pressure, recent travel, pain with eye movement, itchiness, fever, or chills. ? ? ? ?Past Medical History:  ?Diagnosis Date  ? Tongue cancer ([place])   ? ?  ?Social History  ? ?Tobacco Use  ? Smoking status: Never  ? Smokeless tobacco: Never  ?Substance Use Topics  ? Alcohol use: Yes  ?  Comment: occ  ? Drug use: No  ? ? ?Past Surgical History:  ?Procedure Laterality Date  ? [place]  ? TONSILLECTOMY    ? wisdom teeth removal  1981  ? ? ?Family History  ?Problem Relation Age of Onset  ? Breast cancer Maternal Grandmother   ? Coronary artery disease Other   ? Hyperlipidemia Other   ? Hypertension Other   ? Emphysema Paternal Grandfather   ?     smoked  ? Stomach cancer Paternal Grandmother   ? Colon cancer Brother 17  ? ? ?No Known Allergies ? ?Current Medications:  ? ?Current Outpatient Medications:  ?  trimethoprim-polymyxin b (POLYTRIM) ophthalmic solution, Place 1 drop into both eyes every 6 (six) hours., Disp: 10 mL, Rfl: 0  ? ?Review of Systems:  ? ?ROS ?Negative unless otherwise specified per HPI. ?Vitals:  ? ?Vitals:  ? 04/25/21 1122  ?BP: 100/60  ?Pulse: 60   ?Temp: 98.1 ?F (36.7 ?C)  ?TempSrc: Temporal  ?SpO2: 97%  ?Weight: 201 lb 6.1 oz (91.3 kg)  ?Height: 6' (1.829 m)  ?   ?Body mass index is 27.31 kg/m?. ? ?Physical Exam:  ? ?Physical Exam ?Vitals and nursing note reviewed.  ?Constitutional:   ?   General: He is not in acute distress. ?   Appearance: He is well-developed. He is not ill-appearing or toxic-appearing.  ?HENT:  ?   Head: Normocephalic and atraumatic.  ?   Right Ear: Tympanic membrane, ear canal and external ear normal. Tympanic membrane is not erythematous, retracted or bulging.  ?   Left Ear: Tympanic membrane, ear canal and external ear normal. Tympanic membrane is not erythematous, retracted or bulging.  ?   Nose: Nose normal.  ?   Right Sinus: No maxillary sinus tenderness or frontal sinus tenderness.  ?   Left Sinus: No maxillary sinus tenderness or frontal sinus tenderness.  ?   Mouth/Throat:  ?   Pharynx: Uvula midline. No posterior oropharyngeal erythema.  ?Eyes:  ?   General: Lids are normal.  ?   Conjunctiva/sclera: Conjunctivae normal.  ?Neck:  ?   Trachea: Trachea normal.  ?Cardiovascular:  ?   Rate and Rhythm: Normal rate and regular rhythm.  ?   Pulses: Normal pulses.  ?   Heart sounds: Normal heart  sounds, S1 normal and S2 normal.  ?Pulmonary:  ?   Effort: Pulmonary effort is normal.  ?   Breath sounds: Normal breath sounds. No decreased breath sounds, wheezing, rhonchi or rales.  ?Lymphadenopathy:  ?   Cervical: No cervical adenopathy.  ?Skin: ?   General: Skin is warm and dry.  ?Neurological:  ?   Mental Status: He is alert.  ?   GCS: GCS eye subscore is 4. GCS verbal subscore is 5. GCS motor subscore is 6.  ?Psychiatric:     ?   Speech: Speech normal.     ?   Behavior: Behavior normal. Behavior is cooperative.  ? ? ?Assessment and Plan:  ? ?Acute conjunctivitis of both eyes, unspecified acute conjunctivitis type ?No red flags  ?Suspect viral infection in addition to allergies, however due to worsening eye symptoms and development of  crusting, will treat for possible early bacterial infection with trimethoprim-polymyxin B ophthalmic solution, place 1 drop in both eyes four times daily  ?Advised patient to wash hands regularly to prevent further infection ?Follow up if new/worsening symptoms or concerns occur ? ?I,Havlyn C Ratchford,acting as a scribe for [employer], PA.,have documented all relevant documentation on the behalf of Inda Coke, PA,as directed by  Inda Coke, PA while in the presence of Inda Coke, [place]. ? ?IInda Coke, PA, have reviewed all documentation for this visit. The documentation on 04/25/21 for the exam, diagnosis, procedures, and orders are all accurate and complete. ? ? ?Inda Coke, PA-C ? ?

## 2021-05-04 DIAGNOSIS — K603 Anal fistula: Secondary | ICD-10-CM | POA: Diagnosis not present

## 2021-05-16 DIAGNOSIS — Q381 Ankyloglossia: Secondary | ICD-10-CM | POA: Diagnosis not present

## 2021-10-03 ENCOUNTER — Encounter: Payer: Self-pay | Admitting: *Deleted

## 2021-10-24 DIAGNOSIS — F4320 Adjustment disorder, unspecified: Secondary | ICD-10-CM | POA: Diagnosis not present

## 2021-10-24 DIAGNOSIS — Z630 Problems in relationship with spouse or partner: Secondary | ICD-10-CM | POA: Diagnosis not present

## 2021-10-28 DIAGNOSIS — H90A12 Conductive hearing loss, unilateral, left ear with restricted hearing on the contralateral side: Secondary | ICD-10-CM | POA: Diagnosis not present

## 2021-10-28 DIAGNOSIS — Z9889 Other specified postprocedural states: Secondary | ICD-10-CM | POA: Diagnosis not present

## 2021-10-28 DIAGNOSIS — Z8669 Personal history of other diseases of the nervous system and sense organs: Secondary | ICD-10-CM | POA: Diagnosis not present

## 2021-10-28 DIAGNOSIS — Z85810 Personal history of malignant neoplasm of tongue: Secondary | ICD-10-CM | POA: Diagnosis not present

## 2021-10-28 DIAGNOSIS — H918X3 Other specified hearing loss, bilateral: Secondary | ICD-10-CM | POA: Diagnosis not present

## 2021-10-28 DIAGNOSIS — Z01118 Encounter for examination of ears and hearing with other abnormal findings: Secondary | ICD-10-CM | POA: Diagnosis not present

## 2021-10-28 DIAGNOSIS — H7412 Adhesive left middle ear disease: Secondary | ICD-10-CM | POA: Diagnosis not present

## 2021-10-28 DIAGNOSIS — Z9089 Acquired absence of other organs: Secondary | ICD-10-CM | POA: Diagnosis not present

## 2021-10-28 DIAGNOSIS — H6992 Unspecified Eustachian tube disorder, left ear: Secondary | ICD-10-CM | POA: Diagnosis not present

## 2021-10-28 DIAGNOSIS — Z87898 Personal history of other specified conditions: Secondary | ICD-10-CM | POA: Diagnosis not present

## 2021-10-28 DIAGNOSIS — Z8589 Personal history of malignant neoplasm of other organs and systems: Secondary | ICD-10-CM | POA: Diagnosis not present

## 2021-11-11 DIAGNOSIS — F4320 Adjustment disorder, unspecified: Secondary | ICD-10-CM | POA: Diagnosis not present

## 2021-11-11 DIAGNOSIS — Z630 Problems in relationship with spouse or partner: Secondary | ICD-10-CM | POA: Diagnosis not present

## 2021-12-16 DIAGNOSIS — F4320 Adjustment disorder, unspecified: Secondary | ICD-10-CM | POA: Diagnosis not present

## 2021-12-16 DIAGNOSIS — Z630 Problems in relationship with spouse or partner: Secondary | ICD-10-CM | POA: Diagnosis not present

## 2021-12-22 ENCOUNTER — Encounter: Payer: Self-pay | Admitting: *Deleted

## 2022-01-16 DIAGNOSIS — C029 Malignant neoplasm of tongue, unspecified: Secondary | ICD-10-CM | POA: Diagnosis not present

## 2022-01-18 DIAGNOSIS — K603 Anal fistula: Secondary | ICD-10-CM | POA: Diagnosis not present

## 2022-01-23 DIAGNOSIS — F4320 Adjustment disorder, unspecified: Secondary | ICD-10-CM | POA: Diagnosis not present

## 2022-01-23 DIAGNOSIS — Z630 Problems in relationship with spouse or partner: Secondary | ICD-10-CM | POA: Diagnosis not present

## 2022-02-10 DIAGNOSIS — Z85810 Personal history of malignant neoplasm of tongue: Secondary | ICD-10-CM | POA: Diagnosis not present

## 2022-02-10 DIAGNOSIS — K603 Anal fistula: Secondary | ICD-10-CM | POA: Diagnosis not present

## 2022-02-23 DIAGNOSIS — K603 Anal fistula: Secondary | ICD-10-CM | POA: Diagnosis not present

## 2022-02-23 IMAGING — PT NM PET TUM IMG INITIAL (PI) SKULL BASE T - THIGH
1 series · 1 of 1 positions shown · non-contrast
Comparison: None

CLINICAL DATA: Initial treatment strategy for head neck carcinoma.
Tongue carcinoma. No provided pathology or localization of the
lesion

EXAM:
NUCLEAR MEDICINE PET SKULL BASE TO THIGH
TECHNIQUE: 9.6 mCi F-18 FDG was injected intravenously. Full-ring PET imaging
was performed from the skull base to thigh after the radiotracer. CT
data was obtained and used for attenuation correction and anatomic
localization.
Fasting blood glucose: 105 mg/dl

[Series 1077: results mm oncology reading · 1.0mm · 0.89mm/px · 1 of 1 slices shown]
[im 1/1]
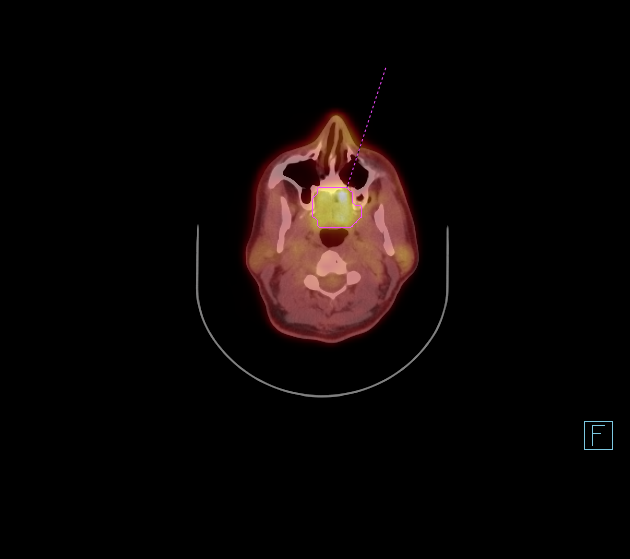

[1 of 1 positions shown; findings below may reference images not displayed]

FINDINGS: Mediastinal blood pool activity: SUV max

Liver activity: SUV max NA

NECK: There is fairly symmetric uptake in the LEFT and RIGHT base of
tongue. No focal activity in the tongue tissue otherwise identified.
There is metabolic activity associated the soft palate greater on
the LEFT (image 24 of the fused data set).

No hypermetabolic cervical lymph nodes are submental lymph nodes are
present.

Incidental CT findings: none

CHEST: No hypermetabolic mediastinal or hilar nodes. No suspicious
pulmonary nodules on the CT scan.

Incidental CT findings: none

ABDOMEN/PELVIS: No abnormal hypermetabolic activity within the
liver, pancreas, adrenal glands, or spleen. No hypermetabolic lymph
nodes in the abdomen or pelvis.

Incidental CT findings: none

SKELETON: No focal hypermetabolic activity to suggest skeletal
metastasis.

Incidental CT findings: none
IMPRESSION: 1. No hypermetabolic or enlarged cervical or submental lymph nodes.
2. No clear focal activity in the tongue. Pathology and lesion
location are not provided. There is moderate activity at the LEFT
and RIGHT base of tongue.
3. Asymmetric activity in the soft palate is presumed physiologic.
Recommend clinical correlation.
4. No evidence distant metastatic disease.

## 2022-05-09 DIAGNOSIS — Z630 Problems in relationship with spouse or partner: Secondary | ICD-10-CM | POA: Diagnosis not present

## 2022-05-09 DIAGNOSIS — F4320 Adjustment disorder, unspecified: Secondary | ICD-10-CM | POA: Diagnosis not present

## 2022-05-23 ENCOUNTER — Ambulatory Visit: Payer: BC Managed Care – PPO | Admitting: Family Medicine

## 2022-05-23 ENCOUNTER — Encounter: Payer: Self-pay | Admitting: Family Medicine

## 2022-05-23 VITALS — BP 110/73 | HR 62 | Temp 97.5°F | Ht 72.0 in | Wt 199.8 lb

## 2022-05-23 DIAGNOSIS — K611 Rectal abscess: Secondary | ICD-10-CM

## 2022-05-23 DIAGNOSIS — C029 Malignant neoplasm of tongue, unspecified: Secondary | ICD-10-CM | POA: Diagnosis not present

## 2022-05-23 DIAGNOSIS — H6690 Otitis media, unspecified, unspecified ear: Secondary | ICD-10-CM

## 2022-05-23 DIAGNOSIS — R739 Hyperglycemia, unspecified: Secondary | ICD-10-CM

## 2022-05-23 DIAGNOSIS — E785 Hyperlipidemia, unspecified: Secondary | ICD-10-CM

## 2022-05-23 MED ORDER — AMOXICILLIN-POT CLAVULANATE 875-125 MG PO TABS: 1.0000 | ORAL_TABLET | Freq: Two times a day (BID) | ORAL | 0 refills | Status: DC

## 2022-05-23 NOTE — Assessment & Plan Note (Signed)
Following with surgery for anal fistula.  Had corrective surgery later this year.  Is doing well postoperatively.

## 2022-05-23 NOTE — Assessment & Plan Note (Signed)
In remission.  Follows with ENT.

## 2022-05-23 NOTE — Progress Notes (Signed)
   RASAN WIEDERKEHR is a 62 y.o. male who presents today for an office visit.  Assessment/Plan:  New/Acute Problems: Otitis media No red flags.  Exam consistent with otitis media.  Will start Augmentin.  He can use over-the-counter meds as needed.  He will let us know if not improving.  Follow-up as needed.  Chronic Problems Addressed Today: Hyperglycemia Advised him to come back soon for CPE.  Check A1c with labs.  Dyslipidemia Advised him to come back soon for CPE and we can check lipids at that time.  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  Follows with ENT.  Perirectal abscess Following with surgery for anal fistula.  Had corrective surgery later this year.  Is doing well postoperatively.     Subjective:  HPI:  Patient here with left sided ear pain. Started about a week ago.  He has had ear infections on that side before and is worried about recurrence.  No fever or chills.  Mild hearing loss.       Objective:  Physical Exam: BP 110/73   Pulse 62   Temp (!) 97.5 F (36.4 C) (Temporal)   Ht 6' (1.829 m)   Wt 199 lb 12.8 oz (90.6 kg)   SpO2 94%   BMI 27.10 kg/m   Gen: No acute distress, resting comfortably HEENT: Right TM clear.  Left TM with effusion and edema. CV: Regular rate and rhythm with no murmurs appreciated Pulm: Normal work of breathing, clear to auscultation bilaterally with no crackles, wheezes, or rhonchi Neuro: Grossly normal, moves all extremities Psych: Normal affect and thought content      Danicia Terhaar M. Jimmey Ralph, MD 05/23/2022 2:20 PM

## 2022-05-23 NOTE — Assessment & Plan Note (Signed)
Advised him to come back soon for CPE.  Check A1c with labs.

## 2022-05-23 NOTE — Patient Instructions (Signed)
It was very nice to see you today!  Start the Augmentin. Let me know if not improving. Come back soon for your physica.   No follow-ups on file.   Take care, Dr Jimmey Ralph  PLEASE NOTE:  If you had any lab tests, please let us know if you have not heard back within a few days. You may see your results on mychart before we have a chance to review them but we will give you a call once they are reviewed by [place].   If we ordered any referrals today, please let us know if you have not heard from their office within the next week.   If you had any urgent prescriptions sent in today, please check with the pharmacy within an hour of our visit to make sure the prescription was transmitted appropriately.   Please try these tips to maintain a healthy lifestyle:  Eat at least 3 REAL meals and 1-2 snacks per day.  Aim for no more than 5 hours between eating.  If you eat breakfast, please do so within one hour of getting up.   Each meal should contain half fruits/vegetables, one quarter protein, and one quarter carbs (no bigger than a computer mouse)  Cut down on sweet beverages. This includes juice, soda, and sweet tea.   Drink at least 1 glass of water with each meal and aim for at least 8 glasses per day  Exercise at least 150 minutes every week.

## 2022-05-23 NOTE — Assessment & Plan Note (Signed)
Advised him to come back soon for CPE and we can check lipids at that time.

## 2022-07-04 ENCOUNTER — Encounter: Payer: Self-pay | Admitting: Family Medicine

## 2022-07-04 ENCOUNTER — Ambulatory Visit (INDEPENDENT_AMBULATORY_CARE_PROVIDER_SITE_OTHER): Payer: BC Managed Care – PPO | Admitting: Family Medicine

## 2022-07-04 VITALS — BP 104/67 | HR 57 | Temp 98.0°F | Ht 72.0 in | Wt 196.4 lb

## 2022-07-04 DIAGNOSIS — M170 Bilateral primary osteoarthritis of knee: Secondary | ICD-10-CM

## 2022-07-04 DIAGNOSIS — R739 Hyperglycemia, unspecified: Secondary | ICD-10-CM | POA: Diagnosis not present

## 2022-07-04 DIAGNOSIS — E785 Hyperlipidemia, unspecified: Secondary | ICD-10-CM | POA: Diagnosis not present

## 2022-07-04 DIAGNOSIS — Z23 Encounter for immunization: Secondary | ICD-10-CM | POA: Diagnosis not present

## 2022-07-04 DIAGNOSIS — Z0000 Encounter for general adult medical examination without abnormal findings: Secondary | ICD-10-CM | POA: Diagnosis not present

## 2022-07-04 DIAGNOSIS — Z125 Encounter for screening for malignant neoplasm of prostate: Secondary | ICD-10-CM | POA: Diagnosis not present

## 2022-07-04 LAB — CBC
HCT: 39.1 % (ref 39.0–52.0)
Hemoglobin: 13.1 g/dL (ref 13.0–17.0)
MCHC: 33.4 g/dL (ref 30.0–36.0)
MCV: 90.6 fl (ref 78.0–100.0)
Platelets: 220 10*3/uL (ref 150.0–400.0)
RBC: 4.32 Mil/uL (ref 4.22–5.81)
RDW: 13.2 % (ref 11.5–15.5)
WBC: 4.9 10*3/uL (ref 4.0–10.5)

## 2022-07-04 LAB — LIPID PANEL
Cholesterol: 160 mg/dL (ref 0–200)
HDL: 42.9 mg/dL (ref >39.00)
LDL Cholesterol: 104 mg/dL — ABNORMAL HIGH (ref 0–99)
NonHDL: 116.91
Total CHOL/HDL Ratio: 4
Triglycerides: 67 mg/dL (ref 0.0–149.0)
VLDL: 13.4 mg/dL (ref 0.0–40.0)

## 2022-07-04 LAB — COMPREHENSIVE METABOLIC PANEL
ALT: 17 U/L (ref 0–53)
AST: 21 U/L (ref 0–37)
Albumin: 3.9 g/dL (ref 3.5–5.2)
Alkaline Phosphatase: 53 U/L (ref 39–117)
BUN: 17 mg/dL (ref 6–23)
CO2: 25 mEq/L (ref 19–32)
Calcium: 9.1 mg/dL (ref 8.4–10.5)
Chloride: 108 mEq/L (ref 96–112)
Creatinine, Ser: 0.82 mg/dL (ref 0.40–1.50)
GFR: 94.11 mL/min (ref >60.00)
Glucose, Bld: 106 mg/dL — ABNORMAL HIGH (ref 70–99)
Potassium: 4.1 mEq/L (ref 3.5–5.1)
Sodium: 139 mEq/L (ref 135–145)
Total Bilirubin: 0.6 mg/dL (ref 0.2–1.2)
Total Protein: 6.3 g/dL (ref 6.0–8.3)

## 2022-07-04 LAB — PSA: PSA: 3.84 ng/mL (ref 0.10–4.00)

## 2022-07-04 LAB — HEMOGLOBIN A1C: Hgb A1c MFr Bld: 5.8 % (ref 4.6–6.5)

## 2022-07-04 LAB — TSH: TSH: 1.96 u[IU]/mL (ref 0.35–5.50)

## 2022-07-04 NOTE — Patient Instructions (Signed)
It was very nice to see you today!  We will check blood work today.  Please continue to work on diet and exercise treatment  Will give your shingles vaccine today.  Return in about 1 year (around 07/04/2023) for Annual Physical.   Take care, Dr Jimmey Ralph  PLEASE NOTE:  If you had any lab tests, please let us know if you have not heard back within a few days. You may see your results on mychart before we have a chance to review them but we will give you a call once they are reviewed by [place].   If we ordered any referrals today, please let us know if you have not heard from their office within the next week.   If you had any urgent prescriptions sent in today, please check with the pharmacy within an hour of our visit to make sure the prescription was transmitted appropriately.   Please try these tips to maintain a healthy lifestyle:  Eat at least 3 REAL meals and 1-2 snacks per day.  Aim for no more than 5 hours between eating.  If you eat breakfast, please do so within one hour of getting up.   Each meal should contain half fruits/vegetables, one quarter protein, and one quarter carbs (no bigger than a computer mouse)  Cut down on sweet beverages. This includes juice, soda, and sweet tea.   Drink at least 1 glass of water with each meal and aim for at least 8 glasses per day  Exercise at least 150 minutes every week.    Preventive Care 80-52 Years Old, Male Preventive care refers to lifestyle choices and visits with your health care provider that can promote health and wellness. Preventive care visits are also called wellness exams. What can I expect for my preventive care visit? Counseling During your preventive care visit, your health care provider may ask about your: Medical history, including: Past medical problems. Family medical history. Current health, including: Emotional well-being. Home life and relationship well-being. Sexual activity. Lifestyle, including: Alcohol,  nicotine or tobacco, and drug use. Access to firearms. Diet, exercise, and sleep habits. Safety issues such as seatbelt and bike helmet use. Sunscreen use. Work and work Astronomer. Physical exam Your health care provider will check your: Height and weight. These may be used to calculate your BMI (body mass index). BMI is a measurement that tells if you are at a healthy weight. Waist circumference. This measures the distance around your waistline. This measurement also tells if you are at a healthy weight and may help predict your risk of certain diseases, such as type 2 diabetes and high blood pressure. Heart rate and blood pressure. Body temperature. Skin for abnormal spots. What immunizations do I need?  Vaccines are usually given at various ages, according to a schedule. Your health care provider will recommend vaccines for you based on your age, medical history, and lifestyle or other factors, such as travel or where you work. What tests do I need? Screening Your health care provider may recommend screening tests for certain conditions. This may include: Lipid and cholesterol levels. Diabetes screening. This is done by checking your blood sugar (glucose) after you have not eaten for a while (fasting). Hepatitis B test. Hepatitis C test. HIV (human immunodeficiency virus) test. STI (sexually transmitted infection) testing, if you are at risk. Lung cancer screening. Prostate cancer screening. Colorectal cancer screening. Talk with your health care provider about your test results, treatment options, and if necessary, the need for more  tests. Follow these instructions at home: Eating and drinking  Eat a diet that includes fresh fruits and vegetables, whole grains, lean protein, and low-fat dairy products. Take vitamin and mineral supplements as recommended by your health care provider. Do not drink alcohol if your health care provider tells you not to drink. If you drink  alcohol: Limit how much you have to 0-2 drinks a day. Know how much alcohol is in your drink. In the [place] one drink equals one 12 oz bottle of beer (355 mL), one 5 oz glass of wine (148 mL), or one 1 oz glass of hard liquor (44 mL). Lifestyle Brush your teeth every morning and night with fluoride toothpaste. Floss one time each day. Exercise for at least 30 minutes 5 or more days each week. Do not use any products that contain nicotine or tobacco. These products include cigarettes, chewing tobacco, and vaping devices, such as e-cigarettes. If you need help quitting, ask your health care provider. Do not use drugs. If you are sexually active, practice safe sex. Use a condom or other form of protection to prevent STIs. Take aspirin only as told by your health care provider. Make sure that you understand how much to take and what form to take. Work with your health care provider to find out whether it is safe and beneficial for you to take aspirin daily. Find healthy ways to manage stress, such as: Meditation, yoga, or listening to music. Journaling. Talking to a trusted person. Spending time with friends and family. Minimize exposure to UV radiation to reduce your risk of skin cancer. Safety Always wear your seat belt while driving or riding in a vehicle. Do not drive: If you have been drinking alcohol. Do not ride with someone who has been drinking. When you are tired or distracted. While texting. If you have been using any mind-altering substances or drugs. Wear a helmet and other protective equipment during sports activities. If you have firearms in your house, make sure you follow all gun safety procedures. What's next? Go to your health care provider once a year for an annual wellness visit. Ask your health care provider how often you should have your eyes and teeth checked. Stay up to date on all vaccines. This information is not intended to replace advice given to you by your  health care provider. Make sure you discuss any questions you have with your health care provider. Document Revised: 06/23/2020 Document Reviewed: 06/23/2020 Elsevier Patient Education  2024 [employer].

## 2022-07-04 NOTE — Assessment & Plan Note (Signed)
Overall symptoms are stable.  No longer seeing orthopedics.  We discussed low-impact exercise.  He can use ice and compression as needed as well.  Can also use over-the-counter Tylenol and ibuprofen.

## 2022-07-04 NOTE — Assessment & Plan Note (Signed)
Check lipids. Discussed lifestyle modifications.  

## 2022-07-04 NOTE — Progress Notes (Signed)
Chief Complaint:  Ryan Ingram is a 62 y.o. male who presents today for his annual comprehensive physical exam.    Assessment/Plan:  Chronic Problems Addressed Today: Hyperglycemia Check A1c.  Discussed lifestyle modifications.  Dyslipidemia Check lipids.  Discussed lifestyle modifications.  Bilateral primary osteoarthritis of knee Overall symptoms are stable.  No longer seeing orthopedics.  We discussed low-impact exercise.  He can use ice and compression as needed as well.  Can also use over-the-counter Tylenol and ibuprofen.  Preventative Healthcare: Check labs.  Will give shingles vaccine today.  Due for colonoscopy in 2 years.  Patient Counseling(The following topics were reviewed and/or handout was given):  -Nutrition: Stressed importance of moderation in sodium/caffeine intake, saturated fat and cholesterol, caloric balance, sufficient intake of fresh fruits, vegetables, and fiber.  -Stressed the importance of regular exercise.   -Substance Abuse: Discussed cessation/primary prevention of tobacco, alcohol, or other drug use; driving or other dangerous activities under the influence; availability of treatment for abuse.   -Injury prevention: Discussed safety belts, safety helmets, smoke detector, smoking near bedding or upholstery.   -Sexuality: Discussed sexually transmitted diseases, partner selection, use of condoms, avoidance of unintended pregnancy and contraceptive alternatives.   -Dental health: Discussed importance of regular tooth brushing, flossing, and dental visits.  -Health maintenance and immunizations reviewed. Please refer to Health maintenance section.  Return to care in 1 year for next preventative visit.     Subjective:  HPI:  He has no acute complaints today.   Lifestyle Diet: Balanced. Plenty of fruits and vegetables.  Exercise: Walking more routinely. Does yoga occasionally.      07/04/2022    7:45 AM  Depression screen PHQ 2/9  Decreased  Interest 0  Down, Depressed, Hopeless 0  PHQ - 2 Score 0   There are no preventive care reminders to display for this patient.   ROS: Per HPI, otherwise a complete review of systems was negative.   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 04/06/2021   Perirectal abscess 04/06/2021   Bilateral primary osteoarthritis of knee 03/28/2018   Past Surgical History:  Procedure Laterality Date   ANAL FISSURE REPAIR  1996   TONSILLECTOMY     wisdom teeth removal  1981    Family History  Problem Relation Age of Onset   Breast cancer Maternal Grandmother    Coronary artery disease Other    Hyperlipidemia Other    Hypertension Other    Emphysema Paternal Grandfather        smoked   Stomach cancer Paternal Grandmother    Colon cancer Brother 37    Medications- reviewed and updated Current Outpatient Medications  Medication Sig Dispense Refill   niacinamide 500 MG tablet Take 500 mg by mouth 2 (two) times daily.     No current facility-administered medications for this visit.    Allergies-reviewed and updated No Known Allergies  Social History   Socioeconomic History   Marital status: Married    Spouse name: Not on file   Number of children: Not on file   Years of education: Not on file   Highest education level: Not on file  Occupational History   Occupation: Art gallery manager  Tobacco Use   Smoking  status: Never   Smokeless tobacco: Never  Substance and Sexual Activity   Alcohol use: Yes    Comment: occ   Drug use: No   Sexual activity: Not on file  Other Topics Concern   Not on file  Social History Narrative   Not on file   Social Determinants of Health   Financial Resource Strain: Not on  file  Food Insecurity: Not on file  Transportation Needs: Not on file  Physical Activity: Not on file  Stress: Not on file  Social Connections: Not on file        Objective:  Physical Exam: BP 104/67   Pulse (!) 57   Temp 98 F (36.7 C) (Temporal)   Ht 6' (1.829 m)   Wt 196 lb 6.4 oz (89.1 kg)   SpO2 97%   BMI 26.64 kg/m   Body mass index is 26.64 kg/m. Wt Readings from Last 3 Encounters:  07/04/22 196 lb 6.4 oz (89.1 kg)  05/23/22 199 lb 12.8 oz (90.6 kg)  04/25/21 201 lb 6.1 oz (91.3 kg)   Gen: NAD, resting comfortably HEENT: Right TM clear.  Left TM with otosclerosis.  OP clear. No thyromegaly noted.  CV: RRR with no murmurs appreciated Pulm: NWOB, CTAB with no crackles, wheezes, or rhonchi GI: Normal bowel sounds present. Soft, Nontender, Nondistended. MSK: no edema, cyanosis, or clubbing noted Skin: warm, dry Neuro: CN2-12 grossly intact. Strength 5/5 in upper and lower extremities. Reflexes symmetric and intact bilaterally.  Psych: Normal affect and thought content     Marvyn Torrez M. Jimmey Ralph, MD 07/04/2022 8:11 AM

## 2022-07-04 NOTE — Addendum Note (Signed)
Addended by: Dyann Kief on: 07/04/2022 08:29 AM   Modules accepted: Orders

## 2022-07-04 NOTE — Assessment & Plan Note (Signed)
Check A1c.  Discussed lifestyle modifications. °

## 2022-07-06 NOTE — Progress Notes (Signed)
Cholesterol much better than last year.  His blood sugar is borderline but stable.  The rest of his labs are all stable.  Do not need to make any changes to treatment plan.  He should continue to work on diet and exercise and we can recheck in a year.

## 2022-07-14 ENCOUNTER — Encounter: Payer: Self-pay | Admitting: Family Medicine

## 2022-07-17 NOTE — Telephone Encounter (Signed)
Please advise 

## 2022-07-24 NOTE — Telephone Encounter (Signed)
Please advise 

## 2022-07-24 NOTE — Telephone Encounter (Signed)
It is difficult to determine the significance of trends with the overall values less than 4.  Trends are something we  usually used when getting values in the 4-8 range or higher.   We can recheck again in 3-6 months if he wishes to see if it is trending above 4 OR we can refer him to urology for further evaluation if he wishes to go this route instead.  Katina Degree. Jimmey Ralph, MD 07/24/2022 12:13 PM

## 2022-08-24 ENCOUNTER — Encounter (INDEPENDENT_AMBULATORY_CARE_PROVIDER_SITE_OTHER): Payer: Self-pay

## 2022-08-25 ENCOUNTER — Encounter: Payer: Self-pay | Admitting: Family Medicine

## 2022-08-25 DIAGNOSIS — Q381 Ankyloglossia: Secondary | ICD-10-CM | POA: Diagnosis not present

## 2022-08-28 NOTE — Telephone Encounter (Signed)
Please advise 

## 2022-08-28 NOTE — Telephone Encounter (Signed)
We can recheck again in a couple of months to see if there is a real trend or we can refer him to urology if he prefers that.  Katina Degree. Jimmey Ralph, MD 08/28/2022 9:51 AM

## 2022-08-30 ENCOUNTER — Other Ambulatory Visit: Payer: Self-pay | Admitting: *Deleted

## 2022-08-30 DIAGNOSIS — R972 Elevated prostate specific antigen [PSA]: Secondary | ICD-10-CM

## 2022-08-30 NOTE — Telephone Encounter (Signed)
Urology referral placed

## 2022-09-21 ENCOUNTER — Ambulatory Visit: Payer: BC Managed Care – PPO | Admitting: *Deleted

## 2022-09-25 DIAGNOSIS — H43392 Other vitreous opacities, left eye: Secondary | ICD-10-CM | POA: Diagnosis not present

## 2022-11-03 DIAGNOSIS — Z9889 Other specified postprocedural states: Secondary | ICD-10-CM | POA: Diagnosis not present

## 2022-11-03 DIAGNOSIS — H7420 Discontinuity and dislocation of ear ossicles, unspecified ear: Secondary | ICD-10-CM | POA: Diagnosis not present

## 2022-11-03 DIAGNOSIS — H6992 Unspecified Eustachian tube disorder, left ear: Secondary | ICD-10-CM | POA: Diagnosis not present

## 2022-11-03 DIAGNOSIS — Z0110 Encounter for examination of ears and hearing without abnormal findings: Secondary | ICD-10-CM | POA: Diagnosis not present

## 2022-11-03 DIAGNOSIS — H90A32 Mixed conductive and sensorineural hearing loss, unilateral, left ear with restricted hearing on the contralateral side: Secondary | ICD-10-CM | POA: Diagnosis not present

## 2022-11-03 DIAGNOSIS — Z9089 Acquired absence of other organs: Secondary | ICD-10-CM | POA: Diagnosis not present

## 2022-11-03 DIAGNOSIS — H7412 Adhesive left middle ear disease: Secondary | ICD-10-CM | POA: Diagnosis not present

## 2023-01-16 ENCOUNTER — Ambulatory Visit (INDEPENDENT_AMBULATORY_CARE_PROVIDER_SITE_OTHER): Payer: BC Managed Care – PPO

## 2023-01-16 DIAGNOSIS — Z23 Encounter for immunization: Secondary | ICD-10-CM

## 2023-01-16 NOTE — Progress Notes (Signed)
 Patient is in office today for a nurse visit for  Shingles #2 , per PCP's order. Patient Injection was given in the  Left deltoid. Patient tolerated injection well.

## 2023-01-22 ENCOUNTER — Encounter: Payer: Self-pay | Admitting: Family Medicine

## 2023-02-09 ENCOUNTER — Telehealth: Payer: Self-pay | Admitting: *Deleted

## 2023-02-09 NOTE — Telephone Encounter (Signed)
Copied from CRM [id number]. Topic: Referral - Request for Referral >> Feb 09, 2023  3:03 PM Florestine Avers wrote: Did the patient discuss referral with their provider in the last year? Yes (If No - schedule appointment) (If Yes - send message)  Appointment offered? No  Type of order/referral and detailed reason for visit:   Preference of office, provider, location: Dr. Corbin Ade [facility])  If referral order, have you been seen by this specialty before? No (If Yes, this issue or another issue? When? Where?  Can we respond through MyChart? Yes  Patient is requesting Dr. Jimmey Ralph, send over a new referral to [facility] to see Dr. Corbin Ade in [facility], instead of who he originally referred him too a [facility]. Patient is requesting a call back to confirm that the referral has been changed.

## 2023-05-21 ENCOUNTER — Other Ambulatory Visit: Payer: Self-pay | Admitting: *Deleted

## 2023-05-21 ENCOUNTER — Telehealth: Payer: Self-pay | Admitting: *Deleted

## 2023-05-21 DIAGNOSIS — R972 Elevated prostate specific antigen [PSA]: Secondary | ICD-10-CM

## 2023-05-21 NOTE — Telephone Encounter (Signed)
 Copied from CRM [id number]. Topic: Referral - Request for Referral >> May 21, 2023 12:03 PM Annelle Kiel wrote: Did the patient discuss referral with their provider in the last year? Yes (If No - schedule appointment) (If Yes - send message)  Appointment offered? No  Type of order/referral and detailed reason for visit: urologist   Preference of office, provider, location: dr Dariel Ed[place]   If referral order, have you been seen by this specialty before? No (If Yes, this issue or another issue? When? Where?  Can we respond through MyChart? Yes    Referral send, patient aware  [facility]

## 2023-06-29 ENCOUNTER — Ambulatory Visit: Admitting: Urology

## 2023-06-29 ENCOUNTER — Encounter: Payer: Self-pay | Admitting: Urology

## 2023-06-29 VITALS — BP 112/72 | HR 65 | Ht 72.0 in | Wt 187.0 lb

## 2023-06-29 DIAGNOSIS — R972 Elevated prostate specific antigen [PSA]: Secondary | ICD-10-CM

## 2023-06-29 LAB — URINALYSIS, ROUTINE W REFLEX MICROSCOPIC
Bilirubin, UA: NEGATIVE
Glucose, UA: NEGATIVE
Ketones, UA: NEGATIVE
Leukocytes,UA: NEGATIVE
Nitrite, UA: NEGATIVE
Protein,UA: NEGATIVE
RBC, UA: NEGATIVE
Specific Gravity, UA: 1.025 (ref 1.005–1.030)
Urobilinogen, Ur: 0.2 mg/dL (ref 0.2–1.0)
pH, UA: 5.5 (ref 5.0–7.5)

## 2023-06-29 NOTE — Progress Notes (Signed)
 Assessment: 1. Rising PSA level     Plan: I personally reviewed the patient's chart including provider notes, and lab results. PSA today Today I had a long discussion with the patient regarding PSA and the rationale and controversies of prostate cancer early detection.  I discussed the pros and cons of further evaluation including TRUS and prostate Bx.  Potential adverse events and complications as well as standard instructions were given.  Patient expressed his understanding of these issues. I also discussed the role of MRI in assisting with the evaluation of an elevated PSA and diagnosis of prostate cancer. Will contact him with results.  Chief Complaint:  Chief Complaint  Patient presents with   Rising PSA level    History of Present Illness:  Ryan Ingram is a 63 y.o. male who is seen in consultation from Rodney Clamp, MD for evaluation of a rising PSA level.  PSA results: 1/21 1.4 3/23 2.26 6/24 3.84  No prior history of elevated PSA.  No history of UTIs or prostatitis.  No family history of prostate cancer. He does not have significant lower urinary tract symptoms.  He does have occasional intermittent stream.  No dysuria or gross hematuria. IPSS = 4/2.  Past Medical History:  Past Medical History:  Diagnosis Date   Tongue cancer (HCC)     Past Surgical History:  Past Surgical History:  Procedure Laterality Date   ANAL FISSURE REPAIR  1996   TONSILLECTOMY     wisdom teeth removal  1981    Allergies:  No Known Allergies  Family History:  Family History  Problem Relation Age of Onset   Breast cancer Maternal Grandmother    Coronary artery disease Other    Hyperlipidemia Other    Hypertension Other    Emphysema Paternal Grandfather        smoked   Stomach cancer Paternal Grandmother    Colon cancer Brother 18    Social History:  Social History   Tobacco Use   Smoking status: Never   Smokeless tobacco: Never  Substance Use Topics    Alcohol use: Yes    Comment: occ   Drug use: No    Review of symptoms:  Constitutional:  Negative for unexplained weight loss, night sweats, fever, chills ENT:  Negative for nose bleeds, sinus pain, painful swallowing CV:  Negative for chest pain, shortness of breath, exercise intolerance, palpitations, loss of consciousness Resp:  Negative for cough, wheezing, shortness of breath GI:  Negative for nausea, vomiting, diarrhea, bloody stools GU:  Positives noted in HPI; otherwise negative for gross hematuria, dysuria, urinary incontinence Neuro:  Negative for seizures, poor balance, limb weakness, slurred speech Psych:  Negative for lack of energy, depression, anxiety Endocrine:  Negative for polydipsia, polyuria, symptoms of hypoglycemia (dizziness, hunger, sweating) Hematologic:  Negative for anemia, purpura, petechia, prolonged or excessive bleeding, use of anticoagulants  Allergic:  Negative for difficulty breathing or choking as a result of exposure to anything; no shellfish allergy ; no allergic response (rash/itch) to materials, foods  Physical exam: BP 112/72   Pulse 65   Ht 6' (1.829 m)   Wt 187 lb (84.8 kg)   BMI 25.36 kg/m  GENERAL APPEARANCE:  Well appearing, well developed, well nourished, NAD HEENT: Atraumatic, Normocephalic, oropharynx clear. NECK: Supple without lymphadenopathy or thyromegaly. LUNGS: Clear to auscultation bilaterally. HEART: Regular Rate and Rhythm without murmurs, gallops, or rubs. ABDOMEN: Soft, non-tender, No Masses. EXTREMITIES: Moves all extremities well.  Without clubbing, cyanosis, or edema.  NEUROLOGIC:  Alert and oriented x 3, normal gait, CN II-XII grossly intact.  MENTAL STATUS:  Appropriate. BACK:  Non-tender to palpation.  No CVAT SKIN:  Warm, dry and intact.   GU: Penis:  circumcised Meatus: Normal Scrotum: normal, no masses Testis: normal without masses bilateral Prostate: 30 g, nontender, no nodules Rectum: Normal tone,  no masses  or tenderness   Results: U/A:  negative

## 2023-06-30 LAB — PSA: Prostate Specific Ag, Serum: 3.1 ng/mL (ref 0.0–4.0)

## 2023-07-02 ENCOUNTER — Telehealth: Payer: Self-pay | Admitting: Urology

## 2023-07-02 ENCOUNTER — Ambulatory Visit: Payer: Self-pay | Admitting: Urology

## 2023-07-02 NOTE — Telephone Encounter (Signed)
-----   Message from Adine Manly sent at 07/02/2023 11:59 AM EDT ----- Please schedule follow-up for 6 months with previsit PSA.

## 2023-07-02 NOTE — Telephone Encounter (Signed)
 LVM for patient to call back and schedule

## 2023-11-02 DIAGNOSIS — Q381 Ankyloglossia: Secondary | ICD-10-CM | POA: Diagnosis not present

## 2023-11-02 DIAGNOSIS — H7412 Adhesive left middle ear disease: Secondary | ICD-10-CM | POA: Diagnosis not present

## 2023-11-02 DIAGNOSIS — H6992 Unspecified Eustachian tube disorder, left ear: Secondary | ICD-10-CM | POA: Diagnosis not present

## 2023-11-02 DIAGNOSIS — H90A12 Conductive hearing loss, unilateral, left ear with restricted hearing on the contralateral side: Secondary | ICD-10-CM | POA: Diagnosis not present

## 2023-11-02 DIAGNOSIS — Z0110 Encounter for examination of ears and hearing without abnormal findings: Secondary | ICD-10-CM | POA: Diagnosis not present

## 2023-12-11 ENCOUNTER — Other Ambulatory Visit: Payer: Self-pay

## 2023-12-11 DIAGNOSIS — R972 Elevated prostate specific antigen [PSA]: Secondary | ICD-10-CM

## 2023-12-13 ENCOUNTER — Other Ambulatory Visit

## 2023-12-20 ENCOUNTER — Encounter: Payer: Self-pay | Admitting: Urology

## 2023-12-20 ENCOUNTER — Ambulatory Visit: Admitting: Urology

## 2023-12-20 VITALS — BP 120/81 | HR 66 | Ht 72.0 in | Wt 190.0 lb

## 2023-12-20 DIAGNOSIS — R972 Elevated prostate specific antigen [PSA]: Secondary | ICD-10-CM | POA: Insufficient documentation

## 2023-12-20 LAB — URINALYSIS, ROUTINE W REFLEX MICROSCOPIC
Bilirubin, UA: NEGATIVE
Glucose, UA: NEGATIVE
Ketones, UA: NEGATIVE
Leukocytes,UA: NEGATIVE
Nitrite, UA: NEGATIVE
Protein,UA: NEGATIVE
RBC, UA: NEGATIVE
Specific Gravity, UA: 1.02 (ref 1.005–1.030)
Urobilinogen, Ur: 0.2 mg/dL (ref 0.2–1.0)
pH, UA: 5.5 (ref 5.0–7.5)

## 2023-12-20 NOTE — Progress Notes (Signed)
° °  Assessment: 1. Rising PSA level     Plan: PSA today Will contact him with results and to arrange follow-up.  Chief Complaint:  Chief Complaint  Patient presents with   Rising PSA level     History of Present Illness:  Ryan Ingram is a 63 y.o. male who is seen for further evaluation of a rising PSA level.  PSA results: 1/21 1.4 3/23 2.26 6/24 3.84 6/25 3.1  No prior history of elevated PSA.  No history of UTIs or prostatitis.  No family history of prostate cancer. He does not have significant lower urinary tract symptoms.  He does have occasional intermittent stream.  No dysuria or gross hematuria. IPSS = 4/2.  He returns today for follow-up.  No new urinary symptoms.  No dysuria or gross hematuria. IPSS = 1/0.  Portions of the above documentation were copied from a prior visit for review purposes only.   Past Medical History:  Past Medical History:  Diagnosis Date   Tongue cancer (HCC)     Past Surgical History:  Past Surgical History:  Procedure Laterality Date   ANAL FISSURE REPAIR  1996   TONSILLECTOMY     wisdom teeth removal  1981    Allergies:  No Known Allergies  Family History:  Family History  Problem Relation Age of Onset   Breast cancer Maternal Grandmother    Coronary artery disease Other    Hyperlipidemia Other    Hypertension Other    Emphysema Paternal Grandfather        smoked   Stomach cancer Paternal Grandmother    Colon cancer Brother 9    Social History:  Social History   Tobacco Use   Smoking status: Never   Smokeless tobacco: Never  Substance Use Topics   Alcohol use: Yes    Comment: occ   Drug use: No    ROS: Constitutional:  Negative for fever, chills, weight loss CV: Negative for chest pain, previous MI, hypertension Respiratory:  Negative for shortness of breath, wheezing, sleep apnea, frequent cough GI:  Negative for nausea, vomiting, bloody stool, GERD  Physical exam: BP 120/81   Pulse 66   Ht  6' (1.829 m)   Wt 190 lb (86.2 kg)   BMI 25.77 kg/m  GENERAL APPEARANCE:  Well appearing, well developed, well nourished, NAD HEENT:  Atraumatic, normocephalic, oropharynx clear NECK:  Supple without lymphadenopathy or thyromegaly ABDOMEN:  Soft, non-tender, no masses EXTREMITIES:  Moves all extremities well, without clubbing, cyanosis, or edema NEUROLOGIC:  Alert and oriented x 3, normal gait, CN II-XII grossly intact MENTAL STATUS:  appropriate BACK:  Non-tender to palpation, No CVAT SKIN:  Warm, dry, and intact   Results: U/A: negative

## 2023-12-21 ENCOUNTER — Ambulatory Visit: Payer: Self-pay | Admitting: Urology

## 2023-12-21 LAB — PSA: Prostate Specific Ag, Serum: 3.5 ng/mL (ref 0.0–4.0)

## 2024-01-24 ENCOUNTER — Encounter: Payer: Self-pay | Admitting: Family Medicine

## 2024-01-24 LAB — HM COLONOSCOPY

## 2024-03-24 ENCOUNTER — Institutional Professional Consult (permissible substitution) (INDEPENDENT_AMBULATORY_CARE_PROVIDER_SITE_OTHER): Admitting: Otolaryngology

## 2024-12-18 ENCOUNTER — Ambulatory Visit: Admitting: Urology

## 2024-12-24 ENCOUNTER — Ambulatory Visit: Admitting: Urology
